# Patient Record
Sex: Female | Born: 1985 | Race: White | Hispanic: No | Marital: Married | Smoking: Never smoker
Health system: Southern US, Community
[De-identification: ages and names within clinical notes are randomized; demographics above are authoritative.]

## PROBLEM LIST (undated history)

## (undated) DIAGNOSIS — Z789 Other specified health status: Secondary | ICD-10-CM

## (undated) DIAGNOSIS — R87629 Unspecified abnormal cytological findings in specimens from vagina: Secondary | ICD-10-CM

## (undated) DIAGNOSIS — N390 Urinary tract infection, site not specified: Secondary | ICD-10-CM

## (undated) DIAGNOSIS — R000 Tachycardia, unspecified: Secondary | ICD-10-CM

## (undated) HISTORY — PX: WISDOM TOOTH EXTRACTION: SHX21

## (undated) HISTORY — DX: Other specified health status: Z78.9

## (undated) HISTORY — PX: COLPOSCOPY: SHX161

## (undated) HISTORY — DX: Tachycardia, unspecified: R00.0

---
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
## 2016-06-13 ENCOUNTER — Encounter: Payer: Self-pay | Admitting: Internal Medicine

## 2016-06-22 ENCOUNTER — Encounter (INDEPENDENT_AMBULATORY_CARE_PROVIDER_SITE_OTHER): Payer: Self-pay

## 2016-06-22 ENCOUNTER — Ambulatory Visit (INDEPENDENT_AMBULATORY_CARE_PROVIDER_SITE_OTHER): Payer: 59 | Admitting: Internal Medicine

## 2016-06-22 ENCOUNTER — Encounter: Payer: Self-pay | Admitting: Internal Medicine

## 2016-06-22 VITALS — BP 118/80 | HR 112 | Ht 66.0 in | Wt 130.8 lb

## 2016-06-22 DIAGNOSIS — R000 Tachycardia, unspecified: Secondary | ICD-10-CM

## 2016-06-22 NOTE — Patient Instructions (Signed)
Your physician recommends that you continue on your current medications as directed. Please refer to the Current Medication list given to you today. Your physician wants you to follow-up in: 1 YEAR WITH DR. ROSS.  You will receive a reminder letter in the mail two months in advance. If you don't receive a letter, please call our office to schedule the follow-up appointment.  

## 2016-06-22 NOTE — Progress Notes (Addendum)
Cardiology Office Note   Date:  06/22/2016   ID:  Carol Singleton, DOB 1985/12/24, MRN [id number]  PCP:  Frederich Chick, MD  Cardiologist:   Dietrich Pates, MD   Pt referred for continued eval of sinus tachycardia    History of Present Illness: Carol Singleton is a 31 y.o. female with a history of sinus tachycardia  Takes labetalol Sinus tacycardia was diagnosed as a teenager  Took atenolol for years  Then took a break and episodeds came back    Previously lived in [place]  Dx fall of 2008  [place]  Sister also has it   Took atenolol  Worked with EP   [facility] through 2013   In 2013 decided to go off of meds  Off until 2017  Felt like having more spells   Cardiologist  Followed  Wore monitor   Recomm  Exercises doing  cardio and wts  Rides bide  Does elliptical  No presyncope or syncope     Current Meds  Medication Sig  . labetalol (NORMODYNE) 100 MG tablet Take 100 mg by mouth 2 (two) times daily.  . norethindrone-ethinyl estradiol (JUNEL FE,GILDESS FE,LOESTRIN FE) 1-20 MG-MCG tablet Take 1 tablet by mouth daily.  . [DISCONTINUED] norethindrone-ethinyl estradiol (JUNEL 1/20) 1-20 MG-MCG tablet Take 1 tablet by mouth daily.  . [DISCONTINUED] norethindrone-ethinyl estradiol (MICROGESTIN,JUNEL,LOESTRIN) 1-20 MG-MCG tablet Take 1 tablet by mouth daily.     Allergies:   Patient has no known allergies.   Past Medical History:  Diagnosis Date  . Sinus tachycardia     Past Surgical History:  Procedure Laterality Date  . KNEE ARTHROSCOPY Left 2003     Social History:  The patient  reports that she has never smoked. She has never used smokeless tobacco. She reports that she drinks alcohol. She reports that she does not use drugs.   Family History:  The patient's family history includes CVA in her father; Congestive Heart Failure in her father; Stroke (age of onset: 108) in her father.    ROS:  Please see the history of present illness. All other  systems are reviewed and  Negative to the above problem except as noted.    PHYSICAL EXAM: VS:  BP 120/90   Pulse (!) 107   Ht 5\' 6"  (1.676 m)   Wt 130 lb 12.8 oz (59.3 kg)   BMI 21.11 kg/m   GEN: Well nourished, well developed, in no acute distress  HEENT: normal  Neck: no JVD, carotid bruits, or masses Cardiac: RRR; no murmurs, rubs, or gallops,no edema  Respiratory:  clear to auscultation bilaterally, normal work of breathing GI: soft, nontender, nondistended, + BS  No hepatomegaly  MS: no deformity Moving all extremities   Skin: warm and dry, no rash Neuro:  Strength and sensation are intact Psych: euthymic mood, full affect   EKG:  EKG is ordered today.  Sinus tachycardia  107 bpm    Lipid Panel No results found for: CHOL, TRIG, HDL, CHOLHDL, VLDL, LDLCALC, LDLDIRECT    Wt Readings from Last 3 Encounters:  06/22/16 130 lb 12.8 oz (59.3 kg)      ASSESSMENT AND PLAN:  1  Inapprop ST  Will have Dr Hebert Soho office send outside records    2  BP  Pt anxious  Says it is normally better    2  HCM  WIll get lipid from Dr Hebert Soho office   Tentatively plan f/u for 1 year  Current medicines are reviewed at length with the patient today.  The patient does not have concerns regarding medicines.   ADDENDUM:  06/29/2016  Notes from outside  [facility]  [place] in[place]rop ST vs autonomic insufficiency  Echo LVEF 60% (07/31/13)   Mention Dr Kristopher Oppenheimhemali for autonomic eval   Wore event monitor  Intolerant to lpressor, toprol and atenololl    Lipids 05/2013:  LDL 108  HDL 53  Total 179     Signed, Dietrich PatesPaula Yessika Otte, MD  06/22/2016 3:55 PM    [facility] [address], [place], [place]  [address] Phone: [phone]; Fax: [phone]

## 2016-07-18 ENCOUNTER — Other Ambulatory Visit (HOSPITAL_COMMUNITY)
Admission: RE | Admit: 2016-07-18 | Discharge: 2016-07-18 | Disposition: A | Discharge status: Home / Self Care | Payer: 59 | Source: Ambulatory Visit | Attending: Obstetrics & Gynecology | Admitting: Obstetrics & Gynecology

## 2016-07-18 ENCOUNTER — Other Ambulatory Visit: Payer: Self-pay | Admitting: Obstetrics & Gynecology

## 2016-07-18 DIAGNOSIS — Z1151 Encounter for screening for human papillomavirus (HPV): Secondary | ICD-10-CM | POA: Insufficient documentation

## 2016-07-18 DIAGNOSIS — Z01419 Encounter for gynecological examination (general) (routine) without abnormal findings: Secondary | ICD-10-CM | POA: Insufficient documentation

## 2016-07-23 LAB — CYTOLOGY - PAP
DIAGNOSIS: NEGATIVE
HPV (WINDOPATH): NOT DETECTED

## 2016-08-14 ENCOUNTER — Encounter: Payer: Self-pay | Admitting: *Deleted

## 2016-11-16 ENCOUNTER — Encounter: Payer: 59 | Admitting: Internal Medicine

## 2017-05-22 ENCOUNTER — Encounter: Payer: Self-pay | Admitting: Neurology

## 2017-08-12 ENCOUNTER — Encounter: Payer: Self-pay | Admitting: Neurology

## 2017-08-12 ENCOUNTER — Ambulatory Visit (INDEPENDENT_AMBULATORY_CARE_PROVIDER_SITE_OTHER): Payer: 59 | Admitting: Neurology

## 2017-08-12 VITALS — BP 100/70 | HR 96 | Ht 65.5 in | Wt 135.4 lb

## 2017-08-12 DIAGNOSIS — G500 Trigeminal neuralgia: Secondary | ICD-10-CM | POA: Diagnosis not present

## 2017-08-12 NOTE — Progress Notes (Signed)
[facility] Clinic Note - Initial Visit   Date: 08/12/17  Carol QuanLora Singleton MRN: [id number] DOB: 1985/11/18   Dear Dr. Hyman HopesWebb:  Thank you for your kind referral of Carol Singleton for consultation of facial pain. Although her history is well known to you, please allow us to reiterate it for the purpose of our medical record. The patient was accompanied to the clinic by self.    History of Present Illness: Carol Singleton is a 32 y.o. 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    Starting around the age of 5, she began having sharp shooting pain over the right cheek and radiates down into her jaw, lasting 20-30 seconds sometimes up to 1 minute.  Symptoms are very sporadic without any warning. There is no worsening with brushing teeth, chewing, or talking.  She has not had a spell over the past month, but other times it may occur daily.  She has not taken any medication for it.  She denies any facial weakness.  She has recently stopped her birth control and plans to get pregnant.   Her sister developed Bell's palsy around the age of 32, which self-resolved and returned in high school, which did not improve.    Past Medical History:  Diagnosis Date  . Sinus tachycardia     Past Surgical History:  Procedure Laterality Date  . KNEE ARTHROSCOPY Left 2003     Medications:  Outpatient Encounter Medications as of 08/12/2017  Medication Sig  . labetalol (NORMODYNE) 100 MG tablet Take 100 mg by mouth 2 (two) times daily.  . [DISCONTINUED] norethindrone-ethinyl estradiol (JUNEL FE,GILDESS FE,LOESTRIN FE) 1-20 MG-MCG tablet Take 1 tablet by mouth daily.   No facility-administered encounter medications on file as of 08/12/2017.      Allergies: No Known Allergies  Family History: Family History  Problem Relation Age of Onset  . Stroke Father [id number]       2009  . Congestive Heart Failure Father   . CVA  Father   . Bell's palsy Sister   . Heart attack Neg Hx     Social History: Social History   Tobacco Use  . Smoking status: Never Smoker  . Smokeless tobacco: Never Used  Substance Use Topics  . Alcohol use: Yes  . Drug use: No   Social History   Social History Narrative   Lives with husband in a 2 story home.  No children.  Works for [employer].  Education: Masters.      Review of Systems:  CONSTITUTIONAL: No fevers, chills, night sweats, or weight loss.   EYES: No visual changes or eye pain ENT: No hearing changes.  No history of nose bleeds.   RESPIRATORY: No cough, wheezing and shortness of breath.   CARDIOVASCULAR: Negative for chest pain, and palpitations.   GI: Negative for abdominal discomfort, blood in stools or black stools.  No recent change in bowel habits.   GU:  No history of incontinence.   MUSCLOSKELETAL: No history of joint pain or swelling.  No myalgias.   SKIN: Negative for lesions, rash, and itching.   HEMATOLOGY/ONCOLOGY: Negative for prolonged bleeding, bruising easily, and swollen nodes.  No history of cancer.   ENDOCRINE: Negative for cold or heat intolerance, polydipsia or goiter.   PSYCH:  No depression or anxiety symptoms.   NEURO: As Above.   Vital Signs:  BP 100/70   Pulse 96   Ht 5' 5.5" (1.664 m)  Wt 135 lb 6 oz (61.4 kg)   SpO2 98%   BMI 22.18 kg/m    General Medical Exam:   General:  Well appearing, comfortable.   Eyes/ENT: see cranial nerve examination.   Neck: No masses appreciated.  Full range of motion without tenderness.  No carotid bruits. Respiratory:  Clear to auscultation, good air entry bilaterally.   Cardiac:  Regular rate and rhythm, no murmur.   Extremities:  No deformities, edema, or skin discoloration.  Skin:  No rashes or lesions.  Neurological Exam: MENTAL STATUS including orientation to time, place, person, recent and remote memory, attention span and concentration, language, and fund of knowledge is  normal.  Speech is not dysarthric.  CRANIAL NERVES: II:  No visual field defects.  Unremarkable fundi.   III-IV-VI: Pupils equal round and reactive to light.  Normal conjugate, extra-ocular eye movements in all directions of gaze.  No nystagmus.  No ptosis.   V:  Normal facial sensation.   VII:  Normal facial symmetry and movements.   VIII:  Normal hearing and vestibular function.   IX-X:  Normal palatal movement.   XI:  Normal shoulder shrug and head rotation.   XII:  Normal tongue strength and range of motion, no deviation or fasciculation.  MOTOR: Motor strength is 5/5.  No atrophy, fasciculations or abnormal movements.  No pronator drift.  Tone is normal.    MSRs:  Right                                                                 Left brachioradialis 2+  brachioradialis 2+  biceps 2+  biceps 2+  triceps 2+  triceps 2+  patellar 2+  patellar 2+  ankle jerk 2+  ankle jerk 2+  Hoffman no  Hoffman no  plantar response down  plantar response down   SENSORY:  Normal and symmetric perception of light touch, pinprick, vibration, and proprioception.  Romberg's sign absent.   COORDINATION/GAIT: Normal finger-to- nose-finger.  Intact rapid alternating movements bilaterally.  Able to rise from a chair without using arms.  Gait narrow based and stable. Tandem and stressed gait intact.    IMPRESSION: Carol Singleton is a 32 year-old female with spells of transient right facial pain, most consistent with trigeminal neuralgia. Her exam is normal and non-focal.  It is atypical for symptoms to start at such a young age.  Recommend MRI/A brain (trigeminal nerve protocol) to evaluate for structural pathology such as a vascular loop around the trigeminal nerve.  She would like to review her health benefits first and will call our office to schedule testing. Symptoms are too intermittent and brief to warrant treatment with medication at this time.    Thank you for allowing me to participate in  patient's care.  If I can answer any additional questions, I would be pleased to do so.    Sincerely,    Noelene Gang K. Allena Katz, DO

## 2017-08-12 NOTE — Patient Instructions (Signed)
After reviewing your health benefits, please contact my office if you would like for us to order MRI brain for trigeminal neuralgia.

## 2018-03-08 LAB — OB RESULTS CONSOLE GC/CHLAMYDIA
Chlamydia: NEGATIVE
Gonorrhea: NEGATIVE

## 2018-03-08 LAB — OB RESULTS CONSOLE ANTIBODY SCREEN: Antibody Screen: NEGATIVE

## 2018-03-08 LAB — OB RESULTS CONSOLE RPR: RPR: NONREACTIVE

## 2018-03-08 LAB — OB RESULTS CONSOLE ABO/RH: RH Type: POSITIVE

## 2018-03-08 LAB — OB RESULTS CONSOLE RUBELLA ANTIBODY, IGM: Rubella: IMMUNE

## 2018-03-08 LAB — OB RESULTS CONSOLE HIV ANTIBODY (ROUTINE TESTING): HIV: NONREACTIVE

## 2018-03-08 LAB — OB RESULTS CONSOLE HEPATITIS B SURFACE ANTIGEN: Hepatitis B Surface Ag: NEGATIVE

## 2018-03-10 ENCOUNTER — Telehealth: Payer: Self-pay | Admitting: Internal Medicine

## 2018-03-10 NOTE — Telephone Encounter (Signed)
Left message for patient to call.

## 2018-03-10 NOTE — Telephone Encounter (Signed)
New Message   Pt c/o medication issue:  1. Name of Medication: labetalol (NORMODYNE) 100 MG tablet    2. How are you currently taking this medication (dosage and times per day)?   3. Are you having a reaction (difficulty breathing--STAT)?   4. What is your medication issue? Patient is calling because she is pregnant and is wanting to taper off this medication and would like to know the best way how. Please call to discuss.

## 2018-03-10 NOTE — Telephone Encounter (Signed)
I would recomm taht she take 50 bid     COme in for BP and HR check on day I am in clinic (0ne week)

## 2018-03-10 NOTE — Telephone Encounter (Signed)
Oral labetalol is considered appropriate for the treatment of chronic hypertension in pregnancy (ACOG 203 2019; Magee 2014).   Will need to consider risk/benefit of symptoms of tachycardia. Risk to fetus is bradycardia, hypoglycemia, and respiratory depression post delivery and fetal growth will need to be monitored.   We commonly use labetalol in pregnancy, but will defer to Dr. Tenny Crawoss for need to wean.   If plan to wean, she is already on low dose and could just discontinue or consider decreasing to 50mg  BID for 3 days then stop.

## 2018-03-10 NOTE — Telephone Encounter (Signed)
Pt called to report that she is [redacted] weeks pregnant and her OBGYN has recommended for her to stop/ wean off the labetalol 100mg  bid which she has been  taking for sinus tachycardia.. Last appt was with Dr. Tenny Crawoss on 06/22/16 and her next appt is scheduled with [place] weaver PA on 04/08/18. Advised her that I will talk with Dr. Tenny Crawoss and our [facility] and let her know their advice and recommendations. She reports that she has been feeling well but has been taking her med regularly and not sure how she will feel off of it but she is worried about her pregnancy.

## 2018-03-11 NOTE — Telephone Encounter (Signed)
Advised pt Dr. Tenny Crawoss and Prudence DavidsonKelley [facility] recommendations on her labetalol. Pt will decrease it to 50mg  po bid and will follow up in the office next week on 03/18/18. Pt will call if she develops any problems on the lower dose prior to that visit.

## 2018-03-18 ENCOUNTER — Encounter (INDEPENDENT_AMBULATORY_CARE_PROVIDER_SITE_OTHER): Payer: Self-pay

## 2018-03-18 ENCOUNTER — Ambulatory Visit (INDEPENDENT_AMBULATORY_CARE_PROVIDER_SITE_OTHER): Payer: 59 | Admitting: Internal Medicine

## 2018-03-18 ENCOUNTER — Encounter: Payer: Self-pay | Admitting: Internal Medicine

## 2018-03-18 VITALS — BP 122/86 | HR 103 | Ht 65.5 in | Wt 130.5 lb

## 2018-03-18 DIAGNOSIS — R000 Tachycardia, unspecified: Secondary | ICD-10-CM | POA: Diagnosis not present

## 2018-03-18 NOTE — Patient Instructions (Signed)
Medication Instructions:  Your physician has recommended you make the following change in your medication:  Taper off labetolol -  Take 25 mg twice a day for one week and then 25 mg once a day for one week then stop  If you need a refill on your cardiac medications before your next appointment, please call your pharmacy.   Lab work: none If you have labs (blood work) drawn today and your tests are completely normal, you will receive your results only by: Marland Kitchen. MyChart Message (if you have MyChart) OR . A paper copy in the mail If you have any lab test that is abnormal or we need to change your treatment, we will call you to review the results.  Testing/Procedures: none  Follow-Up: At [facility], you and your health needs are our priority.  As part of our continuing mission to provide you with exceptional heart care, we have created designated Provider Care Teams.  These Care Teams include your primary Cardiologist (physician) and Advanced Practice Providers (APPs -  Physician Assistants and Nurse Practitioners) who all work together to provide you with the care you need, when you need it. You will need a follow up appointment in:  9months.  Please call our office 2 months in advance to schedule this appointment.  You may see Dr. Tenny Crawoss or one of the following Advanced Practice Providers on your designated Care Team: Tereso NewcomerScott Weaver, PA-C Vin MegargelBhagat, [place] JerseyPA-C . Berton BonJanine Hammond, NP  Any Other Special Instructions Will Be Listed Below (If Applicable).

## 2018-03-18 NOTE — Progress Notes (Signed)
Cardiology Office Note   Date:  03/18/2018   ID:  Carol Singleton, DOB 06-02-1985, MRN [id number]  PCP:  Shirlean Mylar, MD  Cardiologist:   Dietrich Pates, MD   Pt referred for continued eval of sinus tachycardia    History of Present Illness: Carol Singleton is a 32 y.o. female with a history of sinus tachycardia.  She has had since she was a teen   Took atenolol   Now labetalol   Has been treated in [place], [place], [place] Seen by EP in past   Came off of meds on 2013   Off again in 2017   Echo LVEF 60% (07/31/13)  Wore event monitor  Intolerant to lpressor, toprol and atenololl       Pt is currently [redacted] wks pregnant Tired at times   Denies dizzienss  No SOB     Meds:  Labetalol 50 bid     Allergies:   Patient has no known allergies.   Past Medical History:  Diagnosis Date  . Sinus tachycardia     Past Surgical History:  Procedure Laterality Date  . KNEE ARTHROSCOPY Left 2003     Social History:  The patient  reports that she has never smoked. She has never used smokeless tobacco. She reports that she drinks alcohol. She reports that she does not use drugs.   Family History:  The patient's family history includes Bell's palsy in her sister; CVA in her father; Congestive Heart Failure in her father; Stroke (age of onset: 57) in her father.    ROS:  Please see the history of present illness. All other systems are reviewed and  Negative to the above problem except as noted.    PHYSICAL EXAM: VS:  BP 122/86   Pulse (!) 103   Ht 5' 5.5" (1.664 m)   Wt 130 lb 8 oz (59.2 kg)   SpO2 98%   BMI 21.39 kg/m   GEN: Well nourished, well developed, in no acute distress  HEENT: normal  Neck:   JVP is normal  No carotid bruits, or masses Cardiac: RRR; no murmurs, rubs, or gallops,no edema  Respiratory:  clear to auscultation bilaterally, normal work of breathing GI: soft, nontender, nondistended, + BS  No hepatomegaly  MS: no deformity Moving all extremities   Skin: warm  and dry, no rash Neuro:  Strength and sensation are intact Psych: euthymic mood, full affect   EKG:  EKG is ordered today.  Sinus tachycardia  103 bpm    Lipid Panel No results found for: CHOL, TRIG, HDL, CHOLHDL, VLDL, LDLCALC, LDLDIRECT    Wt Readings from Last 3 Encounters:  03/18/18 130 lb 8 oz (59.2 kg)  08/12/17 135 lb 6 oz (61.4 kg)  06/22/16 130 lb 12.8 oz (59.3 kg)      ASSESSMENT AND PLAN:  1  Inapprop sinus tachycardai   She is tolerating   Currently [redacted] wks pregnant   Would continue taper of labealol over next couple weeks to off.    I did recomm that she stay hydrated, increased salt intake, stay trained   This prob reflectss a type of dysautonomia.   OVerall, I warned her that Wks 12/13 are probably greatest risk of syncope After that gets better    2  BP  BP is OK    2  HCM  WIll get lipid from Dr Hebert Soho office   F/U in September after she delivers  Sooner if problems develop  Current medicines  are reviewed at length with the patient today.  The patient does not have concerns regarding medicines.   ADDENDUM:  06/29/2016  Notes from outside  [facility]  [facility] inapprop ST vs autonomic insufficiency  Echo LVEF 60% (07/31/13)   Mention Dr Kristopher Oppenheimhemali for autonomic eval   Wore event monitor  Intolerant to lpressor, toprol and atenololl    Lipids 05/2013:  LDL 108  HDL 53  Total 179     Signed, Dietrich PatesPaula Jacyln Carmer, MD  03/18/2018 11:22 PM    [facility] [address], [place], [place]  [address] Phone: [phone](336) [phone]; Fax: [phone]

## 2018-04-08 ENCOUNTER — Ambulatory Visit: Payer: 59 | Admitting: Physician Assistant

## 2018-04-23 NOTE — L&D Delivery Note (Signed)
Delivery Note At 9:15 PM a viable female was delivered via Vaginal, Spontaneous (Presentation: drect OA).  APGAR: 9, 9; weight pending.   Placenta status: active management was started and pitocin given along with gentle traction. Cervical canal contracted around midbody of placenta and lead to cord evulsion. Patient was not bleeding so pitocin was stopped to allow cervix to dilate and placenta was manually removed with gentle traction on the portion present in the vagina. The placenta was examined and found to be intact. Consulted Dr. Mancel Bale who declines beginning antibiotics at this time as the uterus was never entered. Cord: 3 vessel with the following complications: n/a.    Anesthesia:  Epidural Episiotomy:  None Lacerations: 1st degree;Sulcus;Labial Suture Repair: 3.0 vicryl Est. Blood Loss (mL): 298  Mom to postpartum.  Baby to Couplet care / Skin to Skin.  Carol Singleton 11/07/2018, 10:05 PM

## 2018-07-07 ENCOUNTER — Telehealth: Payer: Self-pay | Admitting: Internal Medicine

## 2018-07-07 NOTE — Telephone Encounter (Signed)
Pt is [redacted] weeks pregnant, and weaned off of her Beta-blocker as soon as she found out about her pregnancy. 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 She needs advice for best keeping her unborn baby safe and avoiding a possible heart attack.

## 2018-07-07 NOTE — Telephone Encounter (Signed)
-----  Has to be at work there right now.  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 Possibly could work from home if letter from her doctor.  Adv she should discuss this with OB, may be able to have less hands on work for right now.     ------Tapering off labetalol, had been feeling fine, HR was high like normal 100-125 bpm but manageable.  Over weekend very high, 190 bpm at rest SOB, just sitting in bed.  Notes that her anxiety over working, the virus, has made this worse.    Doesn't want to go back onto labetalol while pregnant.  Staying hydrated.  Took pulse while on the phone 156.  Adv she may need to restart medication and that I will review with Dr. Tenny Craw and call her back.

## 2018-07-09 NOTE — Telephone Encounter (Signed)
Called pt    She is feeling some better   No syncope   Does not take BP Not working now, some from home This has eased stress  Recomm: Hydrate. Salt intake adequate Get BPcuff Keep active as tolerate   Rest when tired. Keep up through MyChart

## 2018-10-07 LAB — OB RESULTS CONSOLE GBS: GBS: NEGATIVE

## 2018-10-30 ENCOUNTER — Other Ambulatory Visit: Payer: Self-pay

## 2018-10-30 ENCOUNTER — Encounter (HOSPITAL_COMMUNITY): Payer: Self-pay

## 2018-10-30 ENCOUNTER — Inpatient Hospital Stay (HOSPITAL_COMMUNITY)
Admission: AD | Admit: 2018-10-30 | Discharge: 2018-10-30 | Disposition: A | Discharge status: Home / Self Care | Payer: Managed Care, Other (non HMO) | Attending: Obstetrics and Gynecology | Admitting: Obstetrics and Gynecology

## 2018-10-30 DIAGNOSIS — O26893 Other specified pregnancy related conditions, third trimester: Secondary | ICD-10-CM | POA: Diagnosis present

## 2018-10-30 DIAGNOSIS — Z3A40 40 weeks gestation of pregnancy: Secondary | ICD-10-CM | POA: Insufficient documentation

## 2018-10-30 DIAGNOSIS — Z3A39 39 weeks gestation of pregnancy: Secondary | ICD-10-CM | POA: Diagnosis not present

## 2018-10-30 DIAGNOSIS — Z0371 Encounter for suspected problem with amniotic cavity and membrane ruled out: Secondary | ICD-10-CM | POA: Diagnosis not present

## 2018-10-30 DIAGNOSIS — Z3689 Encounter for other specified antenatal screening: Secondary | ICD-10-CM

## 2018-10-30 LAB — URINALYSIS, ROUTINE W REFLEX MICROSCOPIC
Bilirubin Urine: NEGATIVE
Glucose, UA: NEGATIVE mg/dL
Hgb urine dipstick: NEGATIVE
Ketones, ur: NEGATIVE mg/dL
Nitrite: NEGATIVE
Protein, ur: NEGATIVE mg/dL
Specific Gravity, Urine: 1.004 — ABNORMAL LOW (ref 1.005–1.030)
pH: 6 (ref 5.0–8.0)

## 2018-10-30 LAB — AMNISURE RUPTURE OF MEMBRANE (ROM) NOT AT ARMC: Amnisure ROM: NEGATIVE

## 2018-10-30 NOTE — MAU Provider Note (Signed)
History     CSN: [id number]  Arrival date and time: 10/30/18 1610   First Provider Initiated Contact with Patient 10/30/18 1715      Chief Complaint  Patient presents with  . Rupture of Membranes   HPI Dr. Richardson Doppole called and wanted amnisure done as client has been having wetness of her clothing and is not sure if her water has broken.  Having some contractions but is not in pain.  Has had some sticky mucus also which is likely her mucus plug.  OB History    Gravida  1   Para      Term      Preterm      AB      Living        SAB      TAB      Ectopic      Multiple      Live Births              Past Medical History:  Diagnosis Date  . Sinus tachycardia     Past Surgical History:  Procedure Laterality Date  . KNEE ARTHROSCOPY Left 2003    Family History  Problem Relation Age of Onset  . Stroke Father [id number]       2009  . Congestive Heart Failure Father   . CVA Father   . Bell's palsy Sister   . Heart attack Neg Hx     Social History   Tobacco Use  . Smoking status: Never Smoker  . Smokeless tobacco: Never Used  Substance Use Topics  . Alcohol use: Not Currently  . Drug use: No    Allergies: No Known Allergies  No medications prior to admission.    Review of Systems  Constitutional: Negative for fever.  Respiratory: Negative for cough and shortness of breath.   Gastrointestinal: Negative for abdominal pain, diarrhea, nausea and vomiting.  Genitourinary: Negative for dysuria, vaginal bleeding and vaginal discharge.       Leaking of fluid?   Physical Exam   Blood pressure 113/79, pulse (!) 114, resp. rate 18.  Physical Exam  Nursing note and vitals reviewed. Constitutional: She is oriented to person, place, and time. She appears well-developed and well-nourished.  HENT:  Head: Normocephalic.  Eyes: EOM are normal.  Neck: Neck supple.  GI: Soft. There is no abdominal tenderness. There is no rebound and no guarding.  FHT baseline is  130 with moderate variability and greater than 15x15 accels.  No decelerations.  Irregular contractions at 4-8 minutes - not painful.  Reactive NST.  Genitourinary:    Genitourinary Comments: Speculum exam Hard to visualize cervix well.  No leaking of fluid.  No leaking with valsalva. Amnisure done. Bimanual  - vertex position -1, cervix is posterior and can barely reach.  Was 2 cm in the office.   Musculoskeletal: Normal range of motion.  Neurological: She is alert and oriented to person, place, and time.  Skin: Skin is warm and dry.  Psychiatric: She has a normal mood and affect.    MAU Course  Procedures Results for orders placed or performed during the hospital encounter of 10/30/18 (from the past 24 hour(s))  Urinalysis, Routine w reflex microscopic     Status: Abnormal   Collection Time: 10/30/18  4:28 PM  Result Value Ref Range   Color, Urine YELLOW YELLOW   APPearance CLEAR CLEAR   Specific Gravity, Urine 1.004 (L) 1.005 - 1.030   pH 6.0 5.0 - 8.0  Glucose, UA NEGATIVE NEGATIVE mg/dL   Hgb urine dipstick NEGATIVE NEGATIVE   Bilirubin Urine NEGATIVE NEGATIVE   Ketones, ur NEGATIVE NEGATIVE mg/dL   Protein, ur NEGATIVE NEGATIVE mg/dL   Nitrite NEGATIVE NEGATIVE   Leukocytes,Ua MODERATE (A) NEGATIVE   RBC / HPF 0-5 0 - 5 RBC/hpf   WBC, UA 6-10 0 - 5 WBC/hpf   Bacteria, UA MANY (A) NONE SEEN   Squamous Epithelial / LPF 0-5 0 - 5  Amnisure rupture of membrane (rom)not at [facility]     Status: None   Collection Time: 10/30/18  4:57 PM  Result Value Ref Range   Amnisure ROM NEGATIVE     MDM Reviewed signs of labor.  Client is not in labor at this time but does have some irregular contractions - may be in early but not active labor.  This may stop completely or if the contractions become stronger and more intense, this may signal the beginning of labor.  Negative amnisure and clinical exam does not show rupture of membranes.  Will discharge as there has been no cervical change  since the check in the office.  Assessment and Plan  No ROM - negative amnisure Reactive NST  Plan Keep your appointments in the office as scheduled. Return here if your water breaks, or if you have vaginal bleeding or if your baby is not moving well. See additional info given to client on AVS.  Joram Venson L Que Meneely 10/30/2018, 11:08 PM

## 2018-10-30 NOTE — MAU Note (Signed)
Pt reports she has had several instances of fluid trickling out over the past week. Pt reports she spoke with her OB provider today and was advised to come to MAU for evaluation.  Pt denies any vaginal bleeding or pain at this time Reports good fetal movement.

## 2018-10-30 NOTE — Discharge Instructions (Signed)
Keep your appointments in the office as scheduled. Return here if your water breaks, or if you have vaginal bleeding or if your baby is not moving well.

## 2018-11-03 ENCOUNTER — Telehealth (HOSPITAL_COMMUNITY): Payer: Self-pay | Admitting: *Deleted

## 2018-11-03 ENCOUNTER — Encounter (HOSPITAL_COMMUNITY): Payer: Self-pay

## 2018-11-03 NOTE — Telephone Encounter (Signed)
Preadmission screen  

## 2018-11-04 ENCOUNTER — Telehealth (HOSPITAL_COMMUNITY): Payer: Self-pay | Admitting: *Deleted

## 2018-11-04 NOTE — Telephone Encounter (Signed)
Preadmission screen  

## 2018-11-05 ENCOUNTER — Telehealth (HOSPITAL_COMMUNITY): Payer: Self-pay | Admitting: *Deleted

## 2018-11-05 ENCOUNTER — Encounter (HOSPITAL_COMMUNITY): Payer: Self-pay | Admitting: *Deleted

## 2018-11-05 NOTE — Telephone Encounter (Signed)
Preadmission screen  

## 2018-11-07 ENCOUNTER — Inpatient Hospital Stay (HOSPITAL_COMMUNITY)
Admission: AD | Admit: 2018-11-07 | Discharge: 2018-11-09 | DRG: 807 | Disposition: A | Discharge status: Home / Self Care | Payer: Managed Care, Other (non HMO) | Attending: Obstetrics & Gynecology | Admitting: Obstetrics & Gynecology

## 2018-11-07 ENCOUNTER — Inpatient Hospital Stay (HOSPITAL_COMMUNITY): Payer: Managed Care, Other (non HMO) | Admitting: Anesthesiology

## 2018-11-07 ENCOUNTER — Other Ambulatory Visit: Payer: Self-pay

## 2018-11-07 ENCOUNTER — Encounter (HOSPITAL_COMMUNITY): Payer: Self-pay

## 2018-11-07 DIAGNOSIS — Z3A40 40 weeks gestation of pregnancy: Secondary | ICD-10-CM

## 2018-11-07 DIAGNOSIS — R000 Tachycardia, unspecified: Secondary | ICD-10-CM | POA: Diagnosis present

## 2018-11-07 DIAGNOSIS — O368130 Decreased fetal movements, third trimester, not applicable or unspecified: Secondary | ICD-10-CM | POA: Diagnosis present

## 2018-11-07 DIAGNOSIS — O365930 Maternal care for other known or suspected poor fetal growth, third trimester, not applicable or unspecified: Principal | ICD-10-CM | POA: Diagnosis present

## 2018-11-07 DIAGNOSIS — Z1159 Encounter for screening for other viral diseases: Secondary | ICD-10-CM

## 2018-11-07 DIAGNOSIS — O26893 Other specified pregnancy related conditions, third trimester: Secondary | ICD-10-CM | POA: Diagnosis present

## 2018-11-07 LAB — CBC
HCT: 39.7 % (ref 36.0–46.0)
Hemoglobin: 13.9 g/dL (ref 12.0–15.0)
MCH: 32.3 pg (ref 26.0–34.0)
MCHC: 35 g/dL (ref 30.0–36.0)
MCV: 92.1 fL (ref 80.0–100.0)
Platelets: 151 10*3/uL (ref 150–400)
RBC: 4.31 MIL/uL (ref 3.87–5.11)
RDW: 13.2 % (ref 11.5–15.5)
WBC: 8.5 10*3/uL (ref 4.0–10.5)
nRBC: 0 % (ref 0.0–0.2)

## 2018-11-07 LAB — TYPE AND SCREEN
ABO/RH(D): A POS
Antibody Screen: NEGATIVE

## 2018-11-07 LAB — SARS CORONAVIRUS 2 BY RT PCR (HOSPITAL ORDER, PERFORMED IN ~~LOC~~ HOSPITAL LAB): SARS Coronavirus 2: NEGATIVE

## 2018-11-07 LAB — ABO/RH: ABO/RH(D): A POS

## 2018-11-07 MED ORDER — ZOLPIDEM TARTRATE 5 MG PO TABS: 5.0000 mg | ORAL_TABLET | Freq: Every evening | ORAL | Status: DC | PRN

## 2018-11-07 MED ORDER — OXYTOCIN 40 UNITS IN NORMAL SALINE INFUSION - SIMPLE MED
2.5000 [IU]/h | INTRAVENOUS | Status: DC
  Administered 2018-11-07: 2.5 [IU]/h via INTRAVENOUS

## 2018-11-07 MED ORDER — ONDANSETRON HCL 4 MG PO TABS: 4.0000 mg | ORAL_TABLET | ORAL | Status: DC | PRN

## 2018-11-07 MED ORDER — SENNOSIDES-DOCUSATE SODIUM 8.6-50 MG PO TABS
2.0000 | ORAL_TABLET | ORAL | Status: DC
  Administered 2018-11-08 (×2): 2 via ORAL
  Filled 2018-11-07 (×2): qty 2

## 2018-11-07 MED ORDER — PHENYLEPHRINE 40 MCG/ML (10ML) SYRINGE FOR IV PUSH (FOR BLOOD PRESSURE SUPPORT): 80.0000 ug | PREFILLED_SYRINGE | INTRAVENOUS | Status: DC | PRN

## 2018-11-07 MED ORDER — TETANUS-DIPHTH-ACELL PERTUSSIS 5-2.5-18.5 LF-MCG/0.5 IM SUSP: 0.5000 mL | Freq: Once | INTRAMUSCULAR | Status: DC

## 2018-11-07 MED ORDER — PRENATAL MULTIVITAMIN CH
1.0000 | ORAL_TABLET | Freq: Every day | ORAL | Status: DC
  Administered 2018-11-08: 1 via ORAL
  Filled 2018-11-07: qty 1

## 2018-11-07 MED ORDER — WITCH HAZEL-GLYCERIN EX PADS: 1.0000 "application " | MEDICATED_PAD | CUTANEOUS | Status: DC | PRN

## 2018-11-07 MED ORDER — ACETAMINOPHEN 325 MG PO TABS: 650.0000 mg | ORAL_TABLET | ORAL | Status: DC | PRN

## 2018-11-07 MED ORDER — EPHEDRINE 5 MG/ML INJ: 10.0000 mg | INTRAVENOUS | Status: DC | PRN

## 2018-11-07 MED ORDER — SOD CITRATE-CITRIC ACID 500-334 MG/5ML PO SOLN
30.0000 mL | ORAL | Status: DC | PRN
  Administered 2018-11-07 (×2): 30 mL via ORAL
  Filled 2018-11-07 (×2): qty 30

## 2018-11-07 MED ORDER — FENTANYL-BUPIVACAINE-NACL 0.5-0.125-0.9 MG/250ML-% EP SOLN
12.0000 mL/h | EPIDURAL | Status: DC | PRN
  Filled 2018-11-07: qty 250

## 2018-11-07 MED ORDER — OXYCODONE-ACETAMINOPHEN 5-325 MG PO TABS: 2.0000 | ORAL_TABLET | ORAL | Status: DC | PRN

## 2018-11-07 MED ORDER — OXYCODONE-ACETAMINOPHEN 5-325 MG PO TABS: 1.0000 | ORAL_TABLET | ORAL | Status: DC | PRN

## 2018-11-07 MED ORDER — PHENYLEPHRINE 40 MCG/ML (10ML) SYRINGE FOR IV PUSH (FOR BLOOD PRESSURE SUPPORT)
80.0000 ug | PREFILLED_SYRINGE | INTRAVENOUS | Status: DC | PRN
  Filled 2018-11-07: qty 10

## 2018-11-07 MED ORDER — OXYTOCIN 40 UNITS IN NORMAL SALINE INFUSION - SIMPLE MED
1.0000 m[IU]/min | INTRAVENOUS | Status: DC
  Administered 2018-11-07: 2 m[IU]/min via INTRAVENOUS
  Filled 2018-11-07: qty 1000

## 2018-11-07 MED ORDER — SODIUM CHLORIDE (PF) 0.9 % IJ SOLN
INTRAMUSCULAR | Status: DC | PRN
  Administered 2018-11-07: 12 mL/h via EPIDURAL

## 2018-11-07 MED ORDER — DIPHENHYDRAMINE HCL 50 MG/ML IJ SOLN: 12.5000 mg | INTRAMUSCULAR | Status: DC | PRN

## 2018-11-07 MED ORDER — LACTATED RINGERS IV SOLN: 500.0000 mL | Freq: Once | INTRAVENOUS | Status: DC

## 2018-11-07 MED ORDER — FENTANYL CITRATE (PF) 100 MCG/2ML IJ SOLN: 50.0000 ug | INTRAMUSCULAR | Status: DC | PRN

## 2018-11-07 MED ORDER — ONDANSETRON HCL 4 MG/2ML IJ SOLN: 4.0000 mg | INTRAMUSCULAR | Status: DC | PRN

## 2018-11-07 MED ORDER — LACTATED RINGERS IV SOLN
INTRAVENOUS | Status: DC
  Administered 2018-11-07 (×3): via INTRAVENOUS

## 2018-11-07 MED ORDER — IBUPROFEN 600 MG PO TABS
600.0000 mg | ORAL_TABLET | Freq: Four times a day (QID) | ORAL | Status: DC
  Administered 2018-11-08 – 2018-11-09 (×7): 600 mg via ORAL
  Filled 2018-11-07 (×6): qty 1

## 2018-11-07 MED ORDER — TERBUTALINE SULFATE 1 MG/ML IJ SOLN: 0.2500 mg | Freq: Once | INTRAMUSCULAR | Status: DC | PRN

## 2018-11-07 MED ORDER — DIBUCAINE (PERIANAL) 1 % EX OINT: 1.0000 "application " | TOPICAL_OINTMENT | CUTANEOUS | Status: DC | PRN

## 2018-11-07 MED ORDER — CALCIUM CARBONATE ANTACID 500 MG PO CHEW: 1.0000 | CHEWABLE_TABLET | Freq: Every day | ORAL | Status: DC | PRN

## 2018-11-07 MED ORDER — ONDANSETRON HCL 4 MG/2ML IJ SOLN: 4.0000 mg | Freq: Four times a day (QID) | INTRAMUSCULAR | Status: DC | PRN

## 2018-11-07 MED ORDER — OXYTOCIN BOLUS FROM INFUSION
500.0000 mL | Freq: Once | INTRAVENOUS | Status: AC
  Administered 2018-11-07: 500 mL via INTRAVENOUS

## 2018-11-07 MED ORDER — LACTATED RINGERS IV SOLN
500.0000 mL | INTRAVENOUS | Status: DC | PRN
  Administered 2018-11-07: 15:00:00 500 mL via INTRAVENOUS

## 2018-11-07 MED ORDER — LIDOCAINE HCL (PF) 1 % IJ SOLN
INTRAMUSCULAR | Status: DC | PRN
  Administered 2018-11-07 (×2): 5 mL via EPIDURAL

## 2018-11-07 MED ORDER — BENZOCAINE-MENTHOL 20-0.5 % EX AERO
1.0000 "application " | INHALATION_SPRAY | CUTANEOUS | Status: DC | PRN
  Administered 2018-11-08: 1 via TOPICAL
  Filled 2018-11-07: qty 56

## 2018-11-07 MED ORDER — LIDOCAINE HCL (PF) 1 % IJ SOLN: 30.0000 mL | INTRAMUSCULAR | Status: DC | PRN

## 2018-11-07 MED ORDER — DIPHENHYDRAMINE HCL 25 MG PO CAPS: 25.0000 mg | ORAL_CAPSULE | Freq: Four times a day (QID) | ORAL | Status: DC | PRN

## 2018-11-07 MED ORDER — SIMETHICONE 80 MG PO CHEW: 80.0000 mg | CHEWABLE_TABLET | ORAL | Status: DC | PRN

## 2018-11-07 MED ORDER — COCONUT OIL OIL: 1.0000 "application " | TOPICAL_OIL | Status: DC | PRN

## 2018-11-07 NOTE — Progress Notes (Signed)
This note also relates to the following rows which could not be included: Dose (milli-units/min) Oxytocin - Cannot attach notes to extension rows Rate (mL/hr) Oxytocin - Cannot attach notes to extension rows Concentration Oxytocin - Cannot attach notes to extension rows  Pitocin off prior to AROM per Dr Nelda Marseille

## 2018-11-07 NOTE — Progress Notes (Signed)
This note also relates to the following rows which could not be included: Dose (milli-units/min) Oxytocin - Cannot attach notes to extension rows Rate (mL/hr) Oxytocin - Cannot attach notes to extension rows Concentration Oxytocin - Cannot attach notes to extension rows  Pitocin restarted at 58milli-units per dr Nelda Marseille after AROM

## 2018-11-07 NOTE — Progress Notes (Signed)
OB PN:  S: Pt was having painful contractions- s/p epidural, resting comfortably  O: BP 117/72   Pulse 74   Temp 97.8 F (36.6 C)   Resp 16   Ht 5' 5.5" (1.664 m)   Wt 68 kg   SpO2 100%   BMI 24.58 kg/m   FHT: 120 bpm, moderate variablity, + accels, variable decel x1 with AROM- no other decels noted Toco: q16min SVE: ant lip/complete/0  A/P: 33 y.o. G1P0 @ [redacted]w[redacted]d for IOL due to BPP 4/8 1. FWB: Cat. I 2. Labor: continue Pit Pain: continue epidural GBS: negative  Janyth Pupa, DO [phone] (cell) [phone] (office)

## 2018-11-07 NOTE — Anesthesia Procedure Notes (Signed)
Epidural Patient location during procedure: OB Start time: 11/07/2018 3:44 PM End time: 11/07/2018 3:53 PM  Staffing Anesthesiologist: Albertha Ghee, MD Performed: anesthesiologist   Preanesthetic Checklist Completed: patient identified, site marked, pre-op evaluation, timeout performed, IV checked, risks and benefits discussed and monitors and equipment checked  Epidural Patient position: sitting Prep: DuraPrep Patient monitoring: heart rate, cardiac monitor, continuous pulse ox and blood pressure Approach: midline Location: L2-L3 Injection technique: LOR saline  Needle:  Needle type: Tuohy  Needle gauge: 17 G Needle length: 9 cm Needle insertion depth: 5 cm Catheter type: closed end flexible Catheter size: 19 Gauge Catheter at skin depth: 11 cm Test dose: negative and Other  Assessment Events: blood not aspirated, injection not painful, no injection resistance and negative IV test  Additional Notes Informed consent obtained prior to proceeding including risk of failure, 1% risk of PDPH, risk of minor discomfort and bruising.  Discussed rare but serious complications including epidural abscess, permanent nerve injury, epidural hematoma.  Discussed alternatives to epidural analgesia and patient desires to proceed.  Timeout performed pre-procedure verifying patient name, procedure, and platelet count.  Patient tolerated procedure well. Reason for block:procedure for pain

## 2018-11-07 NOTE — H&P (Signed)
HPI: 33 y/o G1P0 @ [redacted]w[redacted]d estimated gestational age (as dated by 20 week ultrasound) presents due to [facility] 4/8.  Pt noted decreased fetal movement and small for dates- NST reactive; however BPP 4/8- no tone or breathing, AFI: 10  no Leaking of Fluid,   no Vaginal Bleeding,   irregular Uterine Contractions,  + Fetal Movement, but decreased.  Prenatal care has been provided by Dr. Nelda Marseille  ROS: no HA, no epigastric pain, no visual changes.    Pregnancy complicated by: -sinus tachycardia- previously on Labetalol   Prenatal Transfer Tool  Maternal Diabetes: No Genetic Screening: Normal Maternal Ultrasounds/Referrals: Normal Fetal Ultrasounds or other Referrals:  None Maternal Substance Abuse:  No Significant Maternal Medications:  None Significant Maternal Lab Results: Group B Strep negative   PNL:  GBS neg, Rub Immune, Hep B neg, RPR NR, HIV neg, GC/C neg, glucola:151, 3hr wnl Hgb: 12.7 Blood type: A positive, antibody neg  Immunizations: Tdap: 5/19 Flu: 11/14  OBHx: primip PMHx:  Sinus tachycardia Meds:  PNV Allergy:   Allergies  Allergen Reactions  . Latex Rash   SurgHx: none SocHx:   no Tobacco, no  EtOH, no Illicit Drugs  O: There were no vitals taken for this visit. Gen. AAOx3, NAD CV.  RRR  No murmur.  Resp. CTAB, no wheeze or crackles. Abd. Gravid,  no tenderness,  no rigidity,  no guarding Extr.  no edema B/L , no calf tenderness, neg Homan's B/L FHT: 140 baseline, moderate variability, + accels,  no decels Toco: irregular SVE: 3-4/60/-2, vertex, palpable bag  [place] today: vertex, EFW, 8#8oz (89%)   Labs: see orders  A/P:  33 y.o. G1P0 @ [redacted]w[redacted]d EGA who presents for IOL due to abnormal antepartum fetal surveillance- BPP 4/8 -FWB:  NICHD Cat I FHTs -Labor: plan for Pit per protocol -GBS: negative -Pain management: IV or epidural upon request  Janyth Pupa, DO [phone] (cell) [phone] (office)

## 2018-11-07 NOTE — Anesthesia Preprocedure Evaluation (Signed)
Anesthesia Evaluation  Patient identified by MRN, date of birth, ID band Patient awake    Reviewed: Allergy & Precautions, H&P , NPO status , Patient's Chart, lab work & pertinent test results  Airway Mallampati: II   Neck ROM: full    Dental   Pulmonary neg pulmonary ROS,    breath sounds clear to auscultation       Cardiovascular + dysrhythmias Supra Ventricular Tachycardia  Rhythm:regular Rate:Normal     Neuro/Psych    GI/Hepatic   Endo/Other    Renal/GU      Musculoskeletal   Abdominal   Peds  Hematology   Anesthesia Other Findings   Reproductive/Obstetrics (+) Pregnancy                             Anesthesia Physical Anesthesia Plan  ASA: II  Anesthesia Plan: Epidural   Post-op Pain Management:    Induction: Intravenous  PONV Risk Score and Plan: 2 and Treatment may vary due to age or medical condition  Airway Management Planned: Natural Airway  Additional Equipment:   Intra-op Plan:   Post-operative Plan:   Informed Consent: I have reviewed the patients History and Physical, chart, labs and discussed the procedure including the risks, benefits and alternatives for the proposed anesthesia with the patient or authorized representative who has indicated his/her understanding and acceptance.       Plan Discussed with: CRNA, Anesthesiologist and Surgeon  Anesthesia Plan Comments:         Anesthesia Quick Evaluation

## 2018-11-07 NOTE — MAU Note (Signed)
COvid swab collected

## 2018-11-08 ENCOUNTER — Encounter (HOSPITAL_COMMUNITY): Payer: Self-pay

## 2018-11-08 LAB — CBC
HCT: 30.9 % — ABNORMAL LOW (ref 36.0–46.0)
Hemoglobin: 10.7 g/dL — ABNORMAL LOW (ref 12.0–15.0)
MCH: 32.5 pg (ref 26.0–34.0)
MCHC: 34.6 g/dL (ref 30.0–36.0)
MCV: 93.9 fL (ref 80.0–100.0)
Platelets: 132 10*3/uL — ABNORMAL LOW (ref 150–400)
RBC: 3.29 MIL/uL — ABNORMAL LOW (ref 3.87–5.11)
RDW: 13.2 % (ref 11.5–15.5)
WBC: 13.4 10*3/uL — ABNORMAL HIGH (ref 4.0–10.5)
nRBC: 0 % (ref 0.0–0.2)

## 2018-11-08 LAB — RPR: RPR Ser Ql: NONREACTIVE

## 2018-11-08 NOTE — Anesthesia Postprocedure Evaluation (Signed)
Anesthesia Post Note  Patient: Carol Singleton  Procedure(s) Performed: AN AD HOC LABOR EPIDURAL     Patient location during evaluation: Mother Baby Anesthesia Type: Epidural Level of consciousness: awake and alert Pain management: pain level controlled Vital Signs Assessment: post-procedure vital signs reviewed and stable Respiratory status: spontaneous breathing, nonlabored ventilation and respiratory function stable Cardiovascular status: stable Postop Assessment: no headache, no backache and epidural receding Anesthetic complications: no    Last Vitals:  Vitals:   11/08/18 0050 11/08/18 0500  BP: 92/70 102/72  Pulse: 86 83  Resp: 18 18  Temp: 36.7 C 36.7 C  SpO2: 99% 100%    Last Pain:  Vitals:   11/08/18 0500  TempSrc: Oral  PainSc: 4    Pain Goal:                   Rayvon Char

## 2018-11-08 NOTE — Progress Notes (Signed)
Postpartum Note Day # 1  S:  Patient resting comfortable in bed.  Pain controlled.  Tolerating general diet. No flatus, no BM.  Lochia moderate.  Ambulating with assistance as her left leg is somewhat still numb.  She denies n/v/f/c, SOB, or CP.  Pt plans on breastfeeding.  O: Temp:  [97.7 F (36.5 C)-98.4 F (36.9 C)] 98 F (36.7 C) (07/18 0500) Pulse Rate:  [65-141] 83 (07/18 0500) Resp:  [16-20] 18 (07/18 0500) BP: (92-131)/(52-84) 102/72 (07/18 0500) SpO2:  [99 %-100 %] 100 % (07/18 0500) Weight:  [68 kg] 68 kg (07/17 1250)   Gen: A&Ox3, NAD CV: RRR, no MRG Resp: CTAB Abdomen: soft, NT, ND Uterus: firm, non-tender, below umbilicus Ext: No edema, no calf tenderness bilaterally  Labs: CBC pending  A/P: Pt is a 33 y.o. G1P1001 s/p NSVD, PPD#1  - Pain well controlled -GU: voiding freely -GI: Tolerating general diet -Activity: encouraged sitting up to chair and ambulation as tolerated -Prophylaxis: early ambulation -Labs: pending this am  Continue with routine postpartum care  Janyth Pupa, DO [phone] (cell) [phone] (office)

## 2018-11-08 NOTE — Lactation Note (Signed)
This note was copied from a baby's chart. Lactation Consultation Note  Patient Name: Carol Singleton [id number] Date: 11/08/2018 Reason for consult: Initial assessment;Primapara;1st time breastfeeding;Term  37 hours old FT female who is being exclusively BF by his mother, she's a P1. Mom reported (+) breast changes during the pregnancy and she's already familiar with hand expression. When [facility] assisted with hand expression, colostrum was easily obtained out of both breast. She has a DEBP at home.  RN Anise Salvo was already helping mom latching baby to the breast, LC helped with positioning, mom tried the football hold, but she didn't like it, she preferred switching to cross cradle, baby latched easily, had to repositioned a couple of times when he started loosing depth at the breast but after that he was able to complete a 28 minutes feeding with a few audible swallows noted. Parents very pleased, dad changed baby's diaper, he had a void and a stool.  Baby is being monitored for jaundice due to bruising on the face, LC offered to set up a DEBP and mom agreed, she started pumping as soon as she got done nursing baby. Instructions, cleaning and storage were reviewed, as well as milk storage guidelines. Reviewed normal newborn behavior, cluster feeding, newborn jaundice and feeding cues.   Feeding plan:  1. Encouraged mom to feed baby STS 8-12 times/24 hours or sooner if feeding cues are present 2. Mom will pump after feedings, and will feed baby any amount of EBM she may get 3. Hand expression and spoon feeding were also encouraged  BF brochure, BF resources and feeding diary were reviewed. Parents reported all questions and concerns were answered, they're both aware of [place] services and will call PRN.  Maternal Data Formula Feeding for Exclusion: No Has patient been taught Hand Expression?: Yes Does the patient have breastfeeding experience prior to this delivery?: No  Feeding Feeding Type: Breast  Fed  LATCH Score Latch: Grasps breast easily, tongue down, lips flanged, rhythmical sucking.  Audible Swallowing: A few with stimulation  Type of Nipple: Everted at rest and after stimulation  Comfort (Breast/Nipple): Soft / non-tender  Hold (Positioning): Assistance needed to correctly position infant at breast and maintain latch.  LATCH Score: 8  Interventions Interventions: Breast feeding basics reviewed;Assisted with latch;Skin to skin;Breast massage;Hand express;Breast compression;Adjust position;Support pillows;Position options;DEBP  Lactation Tools Discussed/Used Tools: Pump Breast pump type: Double-Electric Breast Pump WIC Program: No Pump Review: Setup, frequency, and cleaning;Milk Storage Initiated by:: MPeck Date initiated:: 11/08/18   Consult Status Consult Status: Follow-up Date: 11/09/18 Follow-up type: In-patient    Martyna Thorns Francene Boyers 11/08/2018, 2:37 PM

## 2018-11-09 LAB — BIRTH TISSUE RECOVERY COLLECTION (PLACENTA DONATION)

## 2018-11-09 MED ORDER — IBUPROFEN 600 MG PO TABS: 600.0000 mg | ORAL_TABLET | Freq: Four times a day (QID) | ORAL | 0 refills | Status: DC

## 2018-11-09 MED ORDER — DOCUSATE SODIUM 100 MG PO CAPS: 100.0000 mg | ORAL_CAPSULE | Freq: Two times a day (BID) | ORAL | 0 refills | Status: DC

## 2018-11-09 NOTE — Discharge Summary (Signed)
OB Discharge Summary     Patient Name: Carol Singleton DOB: [birth year] MRN: [id number]  Date of admission: 11/07/2018 Delivering MD: Kathalene FramesGREER, ELLIS K   Date of discharge: 11/09/2018  Admitting diagnosis: INDUCTION Intrauterine pregnancy: 2267w2d     Secondary diagnosis:  Active Problems:   Labor and delivery, indication for care  Additional problems: none     Discharge diagnosis: Term Pregnancy Delivered                                                                                                Post partum procedures:n/a  Augmentation: AROM and Pitocin  Complications: None  Hospital course:  Induction of Labor With Vaginal Delivery   33 y.o. yo G1P1001 at 5367w2d was admitted to the hospital 11/07/2018 for induction of labor.  Indication for induction: non-reassuring antepartum fetal surveillance.  Patient had an uncomplicated labor course as follows: Membrane Rupture Time/Date: 5:59 PM ,11/07/2018   Intrapartum Procedures: Episiotomy: None [1]                                         Lacerations:  1st degree [2];Sulcus [9];Labial [10]  Patient had delivery of a Viable infant.  Information for the patient's newborn:  Evelina Dunurpin, Boy Avyanna [[id number]][[id number]]  Delivery Method: Vaginal, Spontaneous(Filed from Delivery Summary)    11/07/2018  Details of delivery can be found in separate delivery note.  Patient had a routine postpartum course. Patient is discharged home 11/09/18.  Physical exam  Vitals:   11/08/18 0500 11/08/18 1401 11/08/18 2130 11/09/18 0534  BP: 102/72 122/67 110/75 (!) 117/57  Pulse: 83 88 79 (!) 57  Resp: 18 18 18 18   Temp: 98 F (36.7 C) 98.1 F (36.7 C) 97.7 F (36.5 C) 98.2 F (36.8 C)  TempSrc: Oral Oral Oral Oral  SpO2: 100% 99% 97% 100%  Weight:      Height:       General: alert, cooperative and no distress Lochia: appropriate Uterine Fundus: firm Incision: N/A DVT Evaluation: No evidence of DVT seen on physical exam. Labs: Lab Results  Component Value  Date   WBC 13.4 (H) 11/08/2018   HGB 10.7 (L) 11/08/2018   HCT 30.9 (L) 11/08/2018   MCV 93.9 11/08/2018   PLT 132 (L) 11/08/2018   No flowsheet data found.  Discharge instruction: per After Visit Summary and "Baby and Me Booklet".  After visit meds:  Allergies as of 11/09/2018      Reactions   Latex Rash      Medication List    TAKE these medications   docusate sodium 100 MG capsule Commonly known as: Colace Take 1 capsule (100 mg total) by mouth 2 (two) times daily.   ibuprofen 600 MG tablet Commonly known as: ADVIL Take 1 tablet (600 mg total) by mouth every 6 (six) hours.   labetalol 100 MG tablet Commonly known as: NORMODYNE Take 50 mg by mouth 2 (two) times daily.       Diet: routine diet  Activity: Advance  as tolerated. Pelvic rest for 6 weeks.   Outpatient follow up:6 weeks Follow up Appt: Future Appointments  Date Time Provider [place]  11/11/2018  7:30 AM [facility] 1 [facility] None   Follow up Visit:No follow-ups on file.  Postpartum contraception: Undecided  Newborn Data: Live born female  Birth Weight: 8 lb 3.9 oz (3739 g) APGAR: 9, 9  Newborn Delivery   Birth date/time: 11/07/2018 21:15:00 Delivery type: Vaginal, Spontaneous      Baby Feeding: Breast Disposition:home with mother   11/09/2018 Annalee Genta, DO

## 2018-11-09 NOTE — Discharge Instructions (Signed)

## 2018-11-09 NOTE — Lactation Note (Signed)
This note was copied from a baby's chart. Lactation Consultation Note  Patient Name: Carol Singleton [id number] Date: 11/09/2018 Reason for consult: Follow-up assessment;Term;Primapara;1st time breastfeeding  P1 mother whose infant is now 54 hours old.    Baby was asleep in mother's arms when I arrived.  Mother feels like latching is getting better with practice.  Mother's breasts are soft and non tender and nipples are everted and intact.  Mother will continue to feed 8-12 times/24 hours or sooner if baby shows cues.  Engorgement prevention/treatment discussed.  Encouraged mother to also continue hand expression before/after feedings to help increase milk supply.  Mother is able to obtain colostrum drops with hand expression.  Provided a manual pump with instructions for use.  #24 flange size is appropriate at this time.  Mother has a DEBP for home use.  She also has our OP phone number for questions after discharge.  Father present.   Maternal Data Formula Feeding for Exclusion: No Has patient been taught Hand Expression?: Yes Does the patient have breastfeeding experience prior to this delivery?: No  Feeding Feeding Type: Breast Fed  LATCH Score Latch: Grasps breast easily, tongue down, lips flanged, rhythmical sucking.  Audible Swallowing: A few with stimulation  Type of Nipple: Everted at rest and after stimulation  Comfort (Breast/Nipple): Soft / non-tender  Hold (Positioning): Assistance needed to correctly position infant at breast and maintain latch.  LATCH Score: 8  Interventions    Lactation Tools Discussed/Used Pump Review: Setup, frequency, and cleaning Initiated by:: Kataleya Zaugg Date initiated:: 11/09/18   Consult Status Consult Status: Complete Date: 11/09/18 Follow-up type: Call as needed    Eran Windish R Demia Viera 11/09/2018, 8:49 AM

## 2018-11-11 ENCOUNTER — Other Ambulatory Visit (HOSPITAL_COMMUNITY)
Admission: RE | Admit: 2018-11-11 | Discharge: 2018-11-11 | Disposition: A | Discharge status: Home / Self Care | Payer: Managed Care, Other (non HMO) | Source: Ambulatory Visit

## 2018-11-11 HISTORY — DX: Unspecified abnormal cytological findings in specimens from vagina: R87.629

## 2018-11-13 ENCOUNTER — Inpatient Hospital Stay (HOSPITAL_COMMUNITY): Payer: Managed Care, Other (non HMO)

## 2018-11-13 ENCOUNTER — Inpatient Hospital Stay (HOSPITAL_COMMUNITY)
Admission: AD | Admit: 2018-11-13 | Payer: Managed Care, Other (non HMO) | Source: Home / Self Care | Admitting: Obstetrics & Gynecology

## 2019-08-24 ENCOUNTER — Other Ambulatory Visit: Payer: Self-pay | Admitting: Family Medicine

## 2019-08-24 DIAGNOSIS — N63 Unspecified lump in unspecified breast: Secondary | ICD-10-CM

## 2019-08-31 ENCOUNTER — Ambulatory Visit
Admission: RE | Admit: 2019-08-31 | Discharge: 2019-08-31 | Disposition: A | Discharge status: Home / Self Care | Payer: Managed Care, Other (non HMO) | Source: Ambulatory Visit | Attending: Family Medicine | Admitting: Family Medicine

## 2019-08-31 ENCOUNTER — Other Ambulatory Visit: Payer: Self-pay

## 2019-08-31 DIAGNOSIS — N63 Unspecified lump in unspecified breast: Secondary | ICD-10-CM

## 2021-01-17 IMAGING — MG DIGITAL DIAGNOSTIC BILAT W/ TOMO W/ CAD
6 of 10 series · 6 of 30 positions shown · non-contrast
Comparison: None.

CLINICAL DATA: 33-year-old breast-feeding female presents with a
right breast palpable lump for approximately 2 months. The patient
has been breast feeding for approximately 9 months.

EXAM:
DIGITAL DIAGNOSTIC BILATERAL MAMMOGRAM WITH CAD AND TOMO
ULTRASOUND RIGHT BREAST

[R MLO synth-2D]
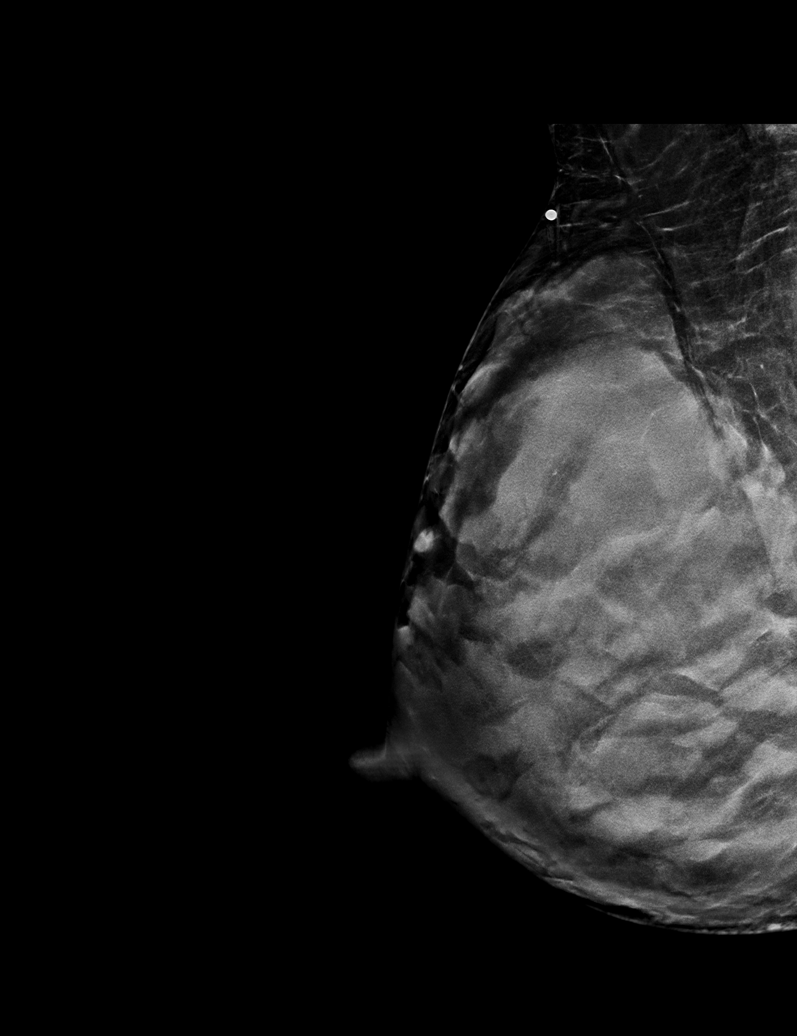

[R CC synth-2D]
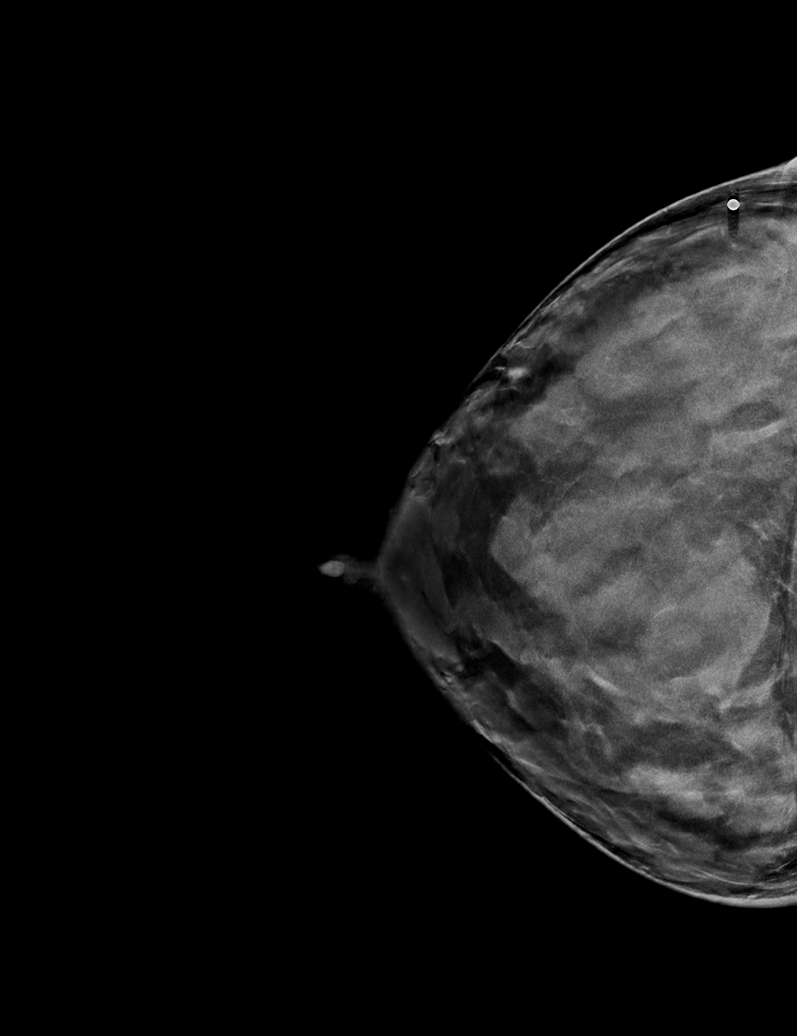

[L CC synth-2D]
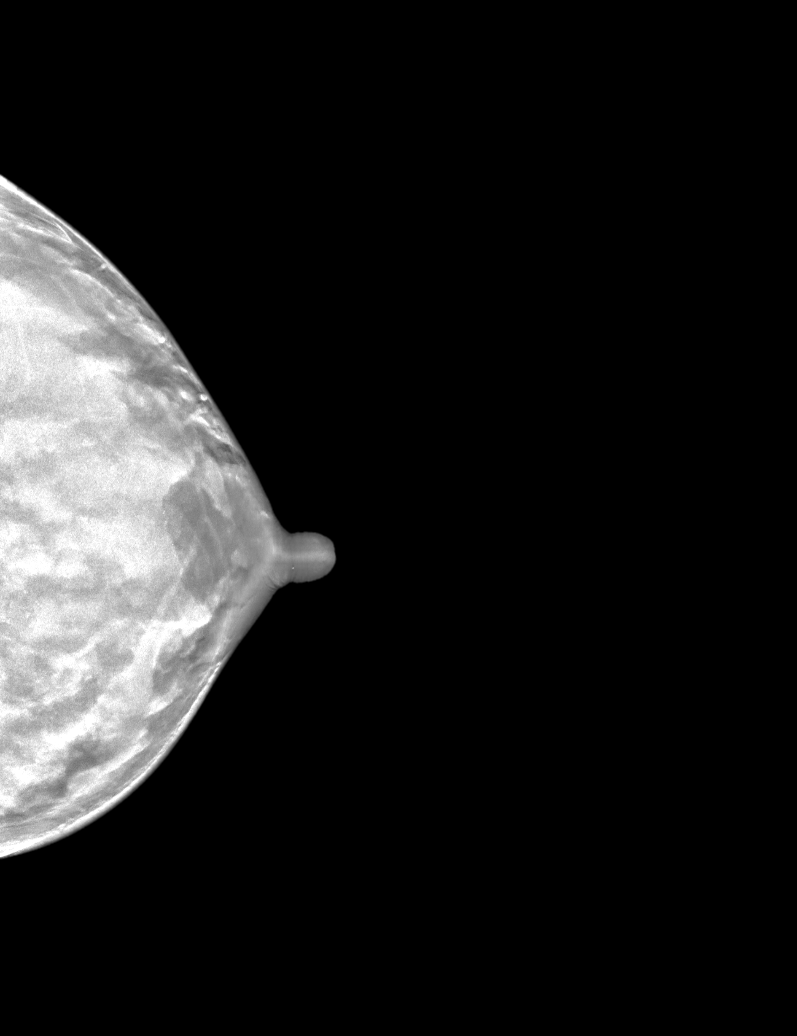

[L MLO synth-2D]
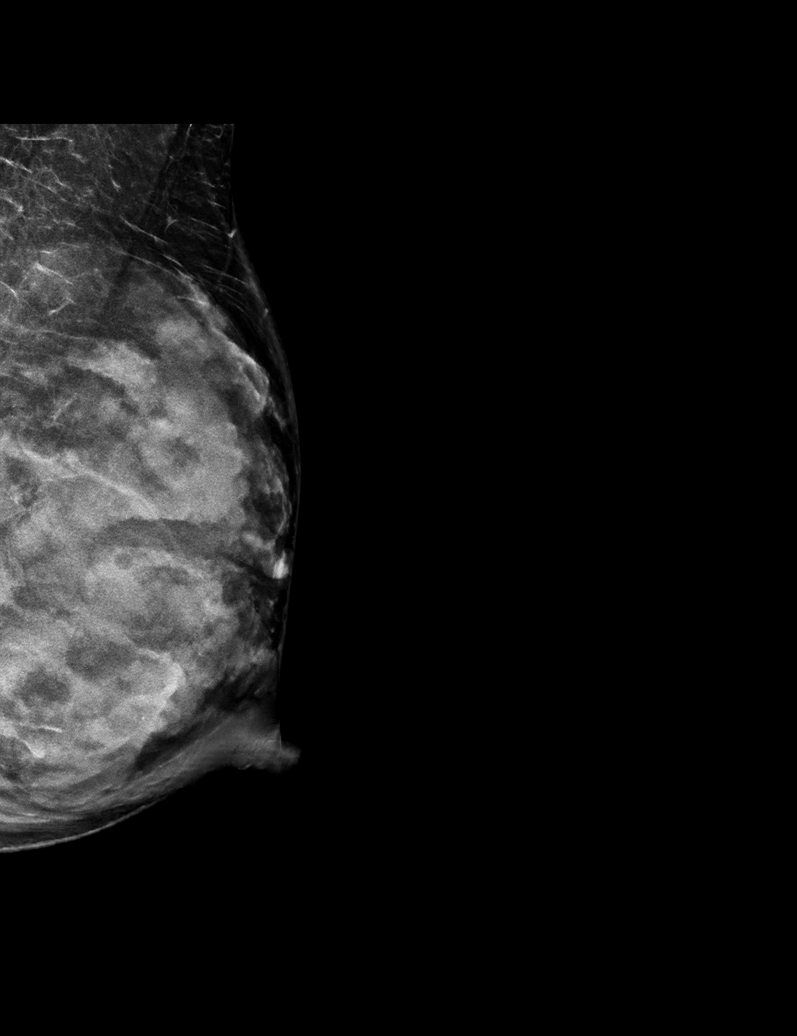

[R TAN synth-2D]
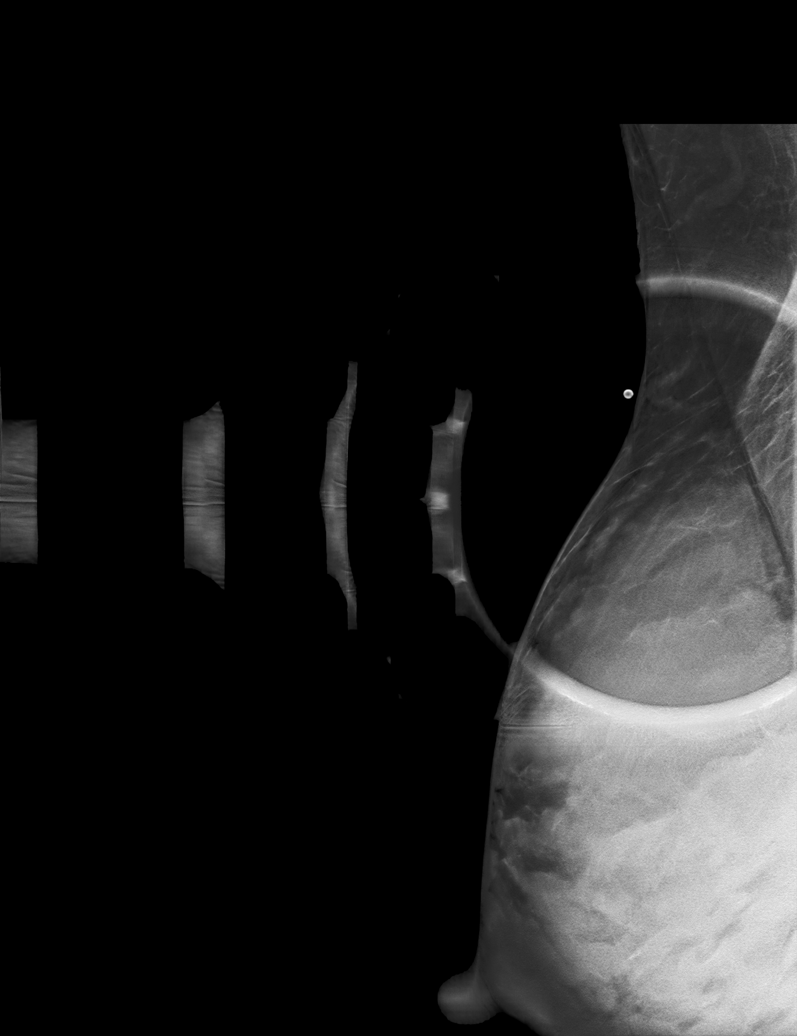

[L MLO tomo · tomo slice 32/63.0]
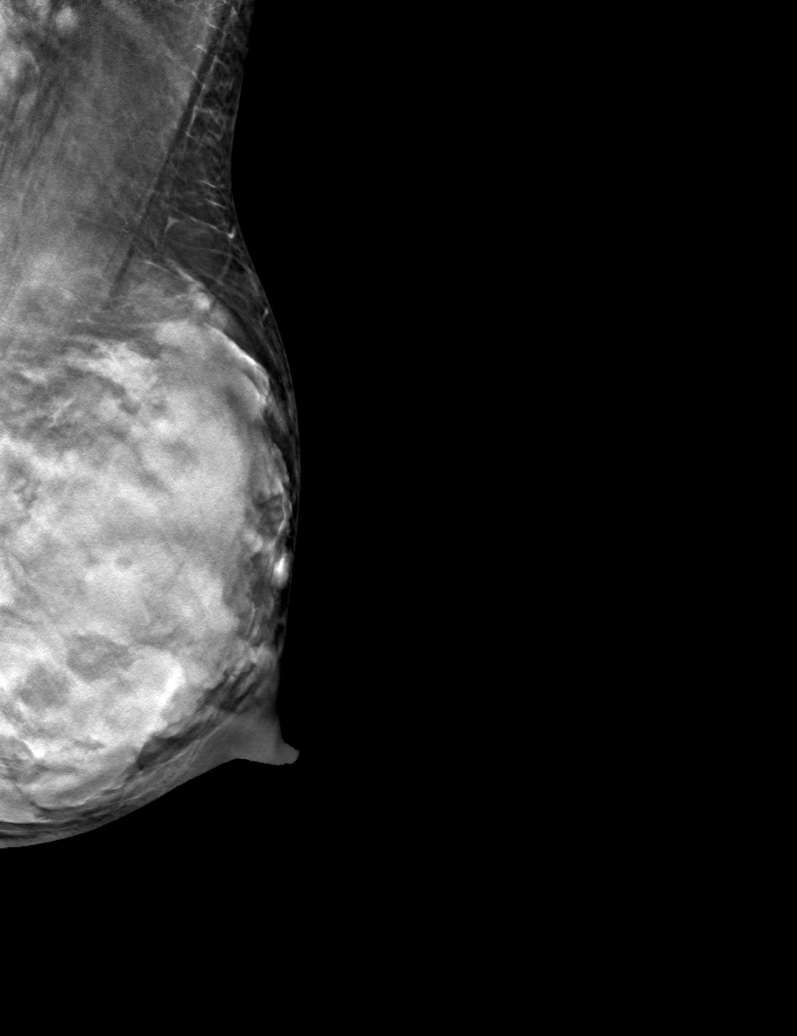

[6 of 30 positions shown; findings below may reference images not displayed]

ACR Breast Density Category d: The breast tissue is extremely dense,
which lowers the sensitivity of mammography.
FINDINGS: A radiopaque BB was placed at the site of the patient's palpable
lump in the far upper outer right breast. No focal or suspicious
mammographic findings are seen deep to the radiopaque BB.

No suspicious findings are identified in either breast.

Mammographic images were processed with CAD.

On physical exam, a very superficial, linear palpable area is
demonstrated in the upper outer right breast.

Targeted ultrasound is performed, showing a superficial vessel with
pulsatile blood flow at the 11 o'clock position of the right breast
along the low axillary tail. This corresponds with the patient's
palpable abnormality and is consistent with a normal blood vessel.
IMPRESSION: Benign superficial blood vessel corresponding with the patient's
right breast palpable abnormality. No further follow-up required.

RECOMMENDATION:
Screening mammogram at age 40 unless there are persistent or
intervening clinical concerns. (Code:KW-W-8EG)

I have discussed the findings and recommendations with the patient.
If applicable, a reminder letter will be sent to the patient
regarding the next appointment.

BI-RADS CATEGORY  2: Benign.

## 2021-03-03 ENCOUNTER — Inpatient Hospital Stay (HOSPITAL_COMMUNITY)
Admission: AD | Admit: 2021-03-03 | Discharge: 2021-03-03 | Disposition: A | Discharge status: Home / Self Care | Payer: Managed Care, Other (non HMO) | Attending: Obstetrics and Gynecology | Admitting: Obstetrics and Gynecology

## 2021-03-03 ENCOUNTER — Encounter (HOSPITAL_COMMUNITY): Payer: Self-pay | Admitting: *Deleted

## 2021-03-03 ENCOUNTER — Inpatient Hospital Stay (HOSPITAL_COMMUNITY): Payer: Managed Care, Other (non HMO)

## 2021-03-03 ENCOUNTER — Other Ambulatory Visit: Payer: Self-pay

## 2021-03-03 DIAGNOSIS — O039 Complete or unspecified spontaneous abortion without complication: Secondary | ICD-10-CM | POA: Insufficient documentation

## 2021-03-03 DIAGNOSIS — Z3A01 Less than 8 weeks gestation of pregnancy: Secondary | ICD-10-CM | POA: Insufficient documentation

## 2021-03-03 DIAGNOSIS — O99891 Other specified diseases and conditions complicating pregnancy: Secondary | ICD-10-CM | POA: Diagnosis present

## 2021-03-03 DIAGNOSIS — O09521 Supervision of elderly multigravida, first trimester: Secondary | ICD-10-CM | POA: Diagnosis not present

## 2021-03-03 DIAGNOSIS — O209 Hemorrhage in early pregnancy, unspecified: Secondary | ICD-10-CM

## 2021-03-03 HISTORY — DX: Urinary tract infection, site not specified: N39.0

## 2021-03-03 LAB — CBC
HCT: 36.4 % (ref 36.0–46.0)
Hemoglobin: 12.2 g/dL (ref 12.0–15.0)
MCH: 30.1 pg (ref 26.0–34.0)
MCHC: 33.5 g/dL (ref 30.0–36.0)
MCV: 89.9 fL (ref 80.0–100.0)
Platelets: 285 10*3/uL (ref 150–400)
RBC: 4.05 MIL/uL (ref 3.87–5.11)
RDW: 11.9 % (ref 11.5–15.5)
WBC: 9 10*3/uL (ref 4.0–10.5)
nRBC: 0 % (ref 0.0–0.2)

## 2021-03-03 LAB — POCT PREGNANCY, URINE: Preg Test, Ur: POSITIVE — AB

## 2021-03-03 LAB — ABO/RH: ABO/RH(D): A POS

## 2021-03-03 LAB — HCG, QUANTITATIVE, PREGNANCY: hCG, Beta Chain, Quant, S: 5458 m[IU]/mL — ABNORMAL HIGH (ref <5)

## 2021-03-03 NOTE — MAU Provider Note (Signed)
History     CSN: [id number]  Arrival date and time: 03/03/21 1523  None     Chief Complaint  Patient presents with   Back Pain   Vaginal Bleeding   HPI Carol Singleton is a 35 y.o. G2P1001 at [redacted]w[redacted]d here for vaginal bleeding. Patient reports bleeding started on Wednesday, initially started out as brown spotting, but is now heavier and bright red. Reports that she saturated the pad that she was wearing prior to arrival, but this was the first time this has happened. She denies passing any clots or tissue. Was seen in the office yesterday and was told everything looked good with the baby and that the baby had a normal heart rate. She has a picture on her phone from yesterday's ultrasound with her today. She also reports some mild abdominal cramping and low back pain, which worsened around 1-2pm today, but has since gotten better.   OB History     Gravida  2   Para  1   Term  1   Preterm      AB      Living  1      SAB      IAB      Ectopic      Multiple  0   Live Births  1           Past Medical History:  Diagnosis Date   Sinus tachycardia    UTI (urinary tract infection)    Vaginal Pap smear, abnormal    bx, colpo; repeat now fine    Past Surgical History:  Procedure Laterality Date   COLPOSCOPY     KNEE ARTHROSCOPY Left 2003   WISDOM TOOTH EXTRACTION      Family History  Problem Relation Age of Onset   Healthy Mother    Stroke Father 68       2009   Congestive Heart Failure Father    CVA Father    Heart disease Father    Bell's palsy Sister    Arrhythmia Sister    Heart attack Neg Hx     Social History   Tobacco Use   Smoking status: Never   Smokeless tobacco: Never  Vaping Use   Vaping Use: Never used  Substance Use Topics   Alcohol use: Not Currently   Drug use: No    Allergies:  Allergies  Allergen Reactions   Latex Rash    No medications prior to admission.    Review of Systems  Constitutional: Negative.   Respiratory:  Negative.    Cardiovascular: Negative.   Gastrointestinal:  Positive for abdominal pain and diarrhea. Negative for nausea and vomiting.  Genitourinary:  Positive for vaginal bleeding. Negative for dysuria and frequency.  Musculoskeletal:  Positive for back pain.  Neurological: Negative.   Physical Exam   Blood pressure 136/78, pulse (!) 110, temperature 98.4 F (36.9 C), temperature source Oral, resp. rate 18, height 5' 5.5" (1.664 m), weight 61.3 kg, last menstrual period 01/14/2021, SpO2 100 %, currently breastfeeding.  Physical Exam Vitals reviewed. Exam conducted with a chaperone present.  Constitutional:      General: She is not in acute distress.    Appearance: She is normal weight.  Eyes:     Pupils: Pupils are equal, round, and reactive to light.  Cardiovascular:     Rate and Rhythm: Normal rate.  Pulmonary:     Effort: Pulmonary effort is normal.  Abdominal:     Palpations: Abdomen is soft.  Tenderness: There is no abdominal tenderness.  Genitourinary:    Vagina: Bleeding present.     Cervix: Normal.     Comments: There is a small clot and what appears to be possible amniotic sac and POC in vagina Musculoskeletal:        General: Normal range of motion.  Skin:    General: Skin is warm and dry.  Neurological:     General: No focal deficit present.     Mental Status: She is alert and oriented to person, place, and time.  Psychiatric:        Mood and Affect: Mood normal.        Behavior: Behavior normal.        Thought Content: Thought content normal.        Judgment: Judgment normal.   US OB LESS THAN 14 WEEKS WITH OB TRANSVAGINAL  Result Date: 03/03/2021 CLINICAL DATA:  Vaginal bleeding EXAM: OBSTETRIC <14 WK [place] AND TRANSVAGINAL OB US TECHNIQUE: Both transabdominal and transvaginal ultrasound examinations were performed for complete evaluation of the gestation as well as the maternal uterus, adnexal regions, and pelvic cul-de-sac. Transvaginal technique was  performed to assess early pregnancy. COMPARISON:  None. FINDINGS: Intrauterine gestational sac: None Yolk sac:  Not Visualized. Embryo:  Not Visualized. Cardiac Activity: Not Visualized. Heart Rate: Not applicable MSD: Not applicable CRL:  Not applicable Subchorionic hemorrhage:  Not applicable. Maternal uterus/adnexae: Normal bilateral ovaries with corpus luteum in the left ovary. The endometrium appears thickened and heterogeneous measuring up to 16 mm. IMPRESSION: Pregnancy of unknown location in this patient with positive urine pregnancy test. No gestational sac visualized. Heterogeneous and thickened endometrium measuring up to 16 mm possibly with some blood products within the endometrial canal. Recommend trending quantitative beta hCGs, close clinical follow-up with OBGYN, and repeat ultrasound as clinically indicated. Electronically Signed   By: Caprice Renshaw M.D.   On: 03/03/2021 17:41    MAU Course  Procedures  MDM CBC, HCG, ABO/RH: A pos, rhogam not indicated [place]  Patient with IUP and FHR on ultrasound in office yesterday. Has picture on phone. Reviewed with Dr. Macon Large. Patient to follow up in office next week.   Assessment and Plan  Vaginal bleeding in pregnancy SAB   - Discharge in stable condition - Bleeding and strict return precautions reviewed. Return to MAU as needed - Patient to follow up at [facility] next week. Will contact Dr. Tenny Craw regarding follow up.    Brand Males, CNM 03/03/2021, 6:49 PM

## 2021-03-03 NOTE — MAU Note (Signed)
Presents with c/o VB that began on Wednesday, reports VB has increased since than, denies passing clots.  Also reports lower abdominal cramping.  Seen in office yesterday and U/S done. LMP 01/14/2021

## 2021-03-03 NOTE — MAU Note (Signed)
Viable [place] fh 104 noted yesterday. No reason was given for bleeding.

## 2021-03-07 LAB — SURGICAL PATHOLOGY

## 2021-04-23 NOTE — L&D Delivery Note (Signed)
Delivery Note Following amniotomy, patient progressed along a normal labor curve to 10/100/+1.  She pushed well for 10 minutes. At 9:45 PM a viable female was delivered via Vaginal, Spontaneous (Presentation: Left Occiput Anterior).  APGAR: 9, 9; weight 8lb 15.6 oz (4070 gm).   Placenta status: Spontaneous, Intact.  Cord: 3 vessels with the following complications: None.  Cord pH: n/a  Anesthesia: Epidural Episiotomy: None Lacerations: Superficial tear along perineal scar from prior laceration that was repaired Suture Repair: 3.0 vicryl rapide Est. Blood Loss (mL): 202  Mom to postpartum.  Baby to Couplet care / Skin to Skin.  [place] 01/28/2022, 10:07 PM

## 2021-07-11 LAB — OB RESULTS CONSOLE HEPATITIS B SURFACE ANTIGEN: Hepatitis B Surface Ag: NEGATIVE

## 2021-07-11 LAB — OB RESULTS CONSOLE RUBELLA ANTIBODY, IGM: Rubella: IMMUNE

## 2021-07-11 LAB — OB RESULTS CONSOLE ABO/RH: RH Type: POSITIVE

## 2021-07-11 LAB — OB RESULTS CONSOLE GC/CHLAMYDIA
Chlamydia: NEGATIVE
Neisseria Gonorrhea: NEGATIVE

## 2021-07-11 LAB — OB RESULTS CONSOLE HIV ANTIBODY (ROUTINE TESTING): HIV: NONREACTIVE

## 2021-07-11 LAB — OB RESULTS CONSOLE ANTIBODY SCREEN: Antibody Screen: NEGATIVE

## 2021-07-11 LAB — HEPATITIS C ANTIBODY: HCV Ab: NEGATIVE

## 2021-07-11 LAB — OB RESULTS CONSOLE RPR: RPR: NONREACTIVE

## 2021-07-23 ENCOUNTER — Other Ambulatory Visit: Payer: Self-pay

## 2021-07-23 ENCOUNTER — Inpatient Hospital Stay (HOSPITAL_COMMUNITY)
Admission: AD | Admit: 2021-07-23 | Discharge: 2021-07-23 | Disposition: A | Discharge status: Home / Self Care | Payer: Managed Care, Other (non HMO) | Attending: Obstetrics and Gynecology | Admitting: Obstetrics and Gynecology

## 2021-07-23 ENCOUNTER — Encounter (HOSPITAL_COMMUNITY): Payer: Self-pay | Admitting: Obstetrics and Gynecology

## 2021-07-23 DIAGNOSIS — K529 Noninfective gastroenteritis and colitis, unspecified: Secondary | ICD-10-CM | POA: Insufficient documentation

## 2021-07-23 DIAGNOSIS — O99611 Diseases of the digestive system complicating pregnancy, first trimester: Secondary | ICD-10-CM | POA: Insufficient documentation

## 2021-07-23 DIAGNOSIS — O219 Vomiting of pregnancy, unspecified: Secondary | ICD-10-CM | POA: Diagnosis present

## 2021-07-23 DIAGNOSIS — Z3A13 13 weeks gestation of pregnancy: Secondary | ICD-10-CM | POA: Insufficient documentation

## 2021-07-23 LAB — URINALYSIS, ROUTINE W REFLEX MICROSCOPIC
Bilirubin Urine: NEGATIVE
Glucose, UA: NEGATIVE mg/dL
Hgb urine dipstick: NEGATIVE
Ketones, ur: 80 mg/dL — AB
Leukocytes,Ua: NEGATIVE
Nitrite: NEGATIVE
Protein, ur: NEGATIVE mg/dL
Specific Gravity, Urine: 1.03 (ref 1.005–1.030)
pH: 5 (ref 5.0–8.0)

## 2021-07-23 LAB — POCT PREGNANCY, URINE: Preg Test, Ur: POSITIVE — AB

## 2021-07-23 LAB — COMPREHENSIVE METABOLIC PANEL
ALT: 12 U/L (ref 0–44)
AST: 25 U/L (ref 15–41)
Albumin: 3.7 g/dL (ref 3.5–5.0)
Alkaline Phosphatase: 41 U/L (ref 38–126)
Anion gap: 9 (ref 5–15)
BUN: 8 mg/dL (ref 6–20)
CO2: 22 mmol/L (ref 22–32)
Calcium: 8.7 mg/dL — ABNORMAL LOW (ref 8.9–10.3)
Chloride: 103 mmol/L (ref 98–111)
Creatinine, Ser: 0.62 mg/dL (ref 0.44–1.00)
GFR, Estimated: >60 mL/min (ref >60)
Glucose, Bld: 120 mg/dL — ABNORMAL HIGH (ref 70–99)
Potassium: 3.4 mmol/L — ABNORMAL LOW (ref 3.5–5.1)
Sodium: 134 mmol/L — ABNORMAL LOW (ref 135–145)
Total Bilirubin: 1.3 mg/dL — ABNORMAL HIGH (ref 0.3–1.2)
Total Protein: 6.9 g/dL (ref 6.5–8.1)

## 2021-07-23 MED ORDER — ONDANSETRON 4 MG PO TBDP: 4.0000 mg | ORAL_TABLET | Freq: Four times a day (QID) | ORAL | 0 refills | Status: AC | PRN

## 2021-07-23 MED ORDER — LACTATED RINGERS IV BOLUS
1000.0000 mL | Freq: Once | INTRAVENOUS | Status: AC
  Administered 2021-07-23: 1000 mL via INTRAVENOUS

## 2021-07-23 MED ORDER — SODIUM CHLORIDE 0.9 % IV SOLN
8.0000 mg | Freq: Once | INTRAVENOUS | Status: AC
  Administered 2021-07-23: 8 mg via INTRAVENOUS
  Filled 2021-07-23: qty 4

## 2021-07-23 MED ORDER — FAMOTIDINE 20 MG PO TABS: 20.0000 mg | ORAL_TABLET | Freq: Two times a day (BID) | ORAL | 0 refills | Status: AC

## 2021-07-23 MED ORDER — LOPERAMIDE HCL 2 MG PO CAPS: 2.0000 mg | ORAL_CAPSULE | Freq: Four times a day (QID) | ORAL | 0 refills | Status: AC | PRN

## 2021-07-23 NOTE — MAU Provider Note (Addendum)
?History  ?  ? ?CSN: [id number] ? ?Arrival date and time: 07/23/21 Carol Singleton ? ? Event Date/Time  ? First Provider Initiated Contact with Patient 07/23/21 1904   ?  ? ?Chief Complaint  ?Patient presents with  ? Nausea  ? Diarrhea  ? ?HPI ? ?Carol Singleton is a 36 y.o. female G33P1011 @ [redacted]w[redacted]d here in MAU with complaints of N/V/D. Symptoms started last night around 11:30 pm. Her young child is in daycare and has the same symptoms. She has vomited over 11 times, and has had 5 episodes of diarrhea. She denies fever or abdominal pain.  ? ?OB History   ? ? Gravida  ?3  ? Para  ?1  ? Term  ?1  ? Preterm  ?   ? AB  ?1  ? Living  ?1  ?  ? ? SAB  ?1  ? IAB  ?   ? Ectopic  ?   ? Multiple  ?0  ? Live Births  ?1  ?   ?  ?  ? ? ?Past Medical History:  ?Diagnosis Date  ? Sinus tachycardia   ? UTI (urinary tract infection)   ? Vaginal Pap smear, abnormal   ? bx, colpo; repeat now fine  ? ? ?Past Surgical History:  ?Procedure Laterality Date  ? COLPOSCOPY    ? KNEE ARTHROSCOPY Left 2003  ? WISDOM TOOTH EXTRACTION    ? ? ?Family History  ?Problem Relation Age of Onset  ? Healthy Mother   ? Stroke Father 24  ?     2009  ? Congestive Heart Failure Father   ? CVA Father   ? Heart disease Father   ? Bell's palsy Sister   ? Arrhythmia Sister   ? Heart attack Neg Hx   ? ? ?Social History  ? ?Tobacco Use  ? Smoking status: Never  ? Smokeless tobacco: Never  ?Vaping Use  ? Vaping Use: Never used  ?Substance Use Topics  ? Alcohol use: Not Currently  ? Drug use: No  ? ? ?Allergies:  ?Allergies  ?Allergen Reactions  ? Latex Rash  ? ? ?Medications Prior to Admission  ?Medication Sig Dispense Refill Last Dose  ? calcium carbonate (TUMS - DOSED IN MG ELEMENTAL CALCIUM) 500 MG chewable tablet Chew 1-2 tablets by mouth 2 (two) times daily as needed for indigestion or heartburn.     ? docusate sodium (COLACE) 100 MG capsule Take 1 capsule (100 mg total) by mouth 2 (two) times daily. 60 capsule 0   ? ibuprofen (ADVIL) 600 MG tablet Take 1 tablet (600 mg  total) by mouth every 6 (six) hours. 30 tablet 0   ? Omega-3 Fatty Acids (FISH OIL PO) Take 1 capsule by mouth daily.     ? Prenatal Vit-Fe Fumarate-FA (PRENATAL MULTIVITAMIN) TABS tablet Take 1 tablet by mouth daily at 12 noon.     ? ?Results for orders placed or performed during the hospital encounter of 07/23/21 (from the past 48 hour(s))  ?Pregnancy, urine POC     Status: Abnormal  ? Collection Time: 07/23/21  6:38 PM  ?Result Value Ref Range  ? Preg Test, Ur POSITIVE (A) NEGATIVE  ?  Comment:        ?THE SENSITIVITY OF THIS ?METHODOLOGY IS >24 mIU/mL ?  ?  ? ?Review of Systems  ?Gastrointestinal:  Positive for diarrhea, nausea and vomiting. Negative for abdominal pain.  ?Genitourinary:  Negative for vaginal bleeding.  ?Physical Exam  ? ?Blood pressure 117/67,  pulse (!) 124, temperature 99 ?F (37.2 ?C), temperature source Oral, resp. rate 18, height 5' 5.5" (1.664 m), weight 62.1 kg, last menstrual period 01/14/2021, SpO2 100 %, unknown if currently breastfeeding. ? ?Physical Exam ?Constitutional:   ?   General: She is not in acute distress. ?   Appearance: Normal appearance. She is ill-appearing. She is not toxic-appearing or diaphoretic.  ?Musculoskeletal:     ?   General: Normal range of motion.  ?Neurological:  ?   Mental Status: She is alert and oriented to person, place, and time.  ?Psychiatric:     ?   Mood and Affect: Mood normal.  ? ?MAU Course  ?Procedures ?None ? ?MDM ? ?+ fetal heart tones.  ?LR bolus X 2 ?Zofran 8 mg IV  ?Report given to Gerrit Heck CNM who resumes care of the patient.  ? ?Assessment and Plan  ?Reassessment (9:03 PM) ? ?Patient give PO fluids for challenge. ?Will send script for zofran and imodium to pharmacy ?Follow up as scheduled. ?Return as necessary ? ? ?Cherre Robins MSN, CNM ?Advanced Practice Provider, [facility] ? ?

## 2021-07-23 NOTE — MAU Note (Signed)
Carol Singleton is a 36 y.o. at [redacted]w[redacted]d here in MAU reporting: states a stomach bug has been running through her house. Started vomiting last night around 2130 and has had about 11 episodes of emesis. 5-6 episodes of diarrhea. Feeling very weak. ? ?Onset of complaint: last night ? ?Pain score: 0/10 ? ?Vitals:  ? 07/23/21 1841  ?BP: 117/67  ?Pulse: (!) 124  ?Resp: 18  ?Temp: 99 ?F (37.2 ?C)  ?SpO2: 100%  ?   ?FHT:167 ? ?Lab orders placed from triage: UA, UPT ? ?

## 2021-08-05 ENCOUNTER — Other Ambulatory Visit: Payer: Self-pay | Admitting: Advanced Practice Midwife

## 2022-01-04 LAB — OB RESULTS CONSOLE GBS: GBS: NEGATIVE

## 2022-01-18 ENCOUNTER — Telehealth (HOSPITAL_COMMUNITY): Payer: Self-pay | Admitting: *Deleted

## 2022-01-18 NOTE — Telephone Encounter (Signed)
Preadmission screen  

## 2022-01-19 ENCOUNTER — Encounter (HOSPITAL_COMMUNITY): Payer: Self-pay

## 2022-01-24 ENCOUNTER — Encounter (HOSPITAL_COMMUNITY): Payer: Self-pay | Admitting: *Deleted

## 2022-01-24 ENCOUNTER — Telehealth (HOSPITAL_COMMUNITY): Payer: Self-pay | Admitting: *Deleted

## 2022-01-24 NOTE — Telephone Encounter (Signed)
Preadmission screen  

## 2022-01-28 ENCOUNTER — Inpatient Hospital Stay (HOSPITAL_COMMUNITY)
Admission: AD | Admit: 2022-01-28 | Discharge: 2022-01-30 | DRG: 807 | Disposition: A | Discharge status: Home / Self Care | Payer: Managed Care, Other (non HMO) | Attending: Obstetrics | Admitting: Obstetrics

## 2022-01-28 ENCOUNTER — Inpatient Hospital Stay (HOSPITAL_COMMUNITY): Payer: Managed Care, Other (non HMO) | Admitting: Anesthesiology

## 2022-01-28 ENCOUNTER — Encounter (HOSPITAL_COMMUNITY): Payer: Self-pay | Admitting: Obstetrics

## 2022-01-28 ENCOUNTER — Other Ambulatory Visit: Payer: Self-pay

## 2022-01-28 DIAGNOSIS — O480 Post-term pregnancy: Secondary | ICD-10-CM | POA: Diagnosis present

## 2022-01-28 DIAGNOSIS — Z3A40 40 weeks gestation of pregnancy: Secondary | ICD-10-CM | POA: Diagnosis not present

## 2022-01-28 LAB — CBC
HCT: 36.3 % (ref 36.0–46.0)
Hemoglobin: 12.9 g/dL (ref 12.0–15.0)
MCH: 32.9 pg (ref 26.0–34.0)
MCHC: 35.5 g/dL (ref 30.0–36.0)
MCV: 92.6 fL (ref 80.0–100.0)
Platelets: 180 10*3/uL (ref 150–400)
RBC: 3.92 MIL/uL (ref 3.87–5.11)
RDW: 13.7 % (ref 11.5–15.5)
WBC: 9.1 10*3/uL (ref 4.0–10.5)
nRBC: 0 % (ref 0.0–0.2)

## 2022-01-28 LAB — TYPE AND SCREEN
ABO/RH(D): A POS
Antibody Screen: NEGATIVE

## 2022-01-28 MED ORDER — DIPHENHYDRAMINE HCL 50 MG/ML IJ SOLN: 12.5000 mg | INTRAMUSCULAR | Status: DC | PRN

## 2022-01-28 MED ORDER — LACTATED RINGERS IV SOLN: 500.0000 mL | INTRAVENOUS | Status: DC | PRN

## 2022-01-28 MED ORDER — SIMETHICONE 80 MG PO CHEW: 80.0000 mg | CHEWABLE_TABLET | ORAL | Status: DC | PRN

## 2022-01-28 MED ORDER — EPHEDRINE 5 MG/ML INJ: 10.0000 mg | INTRAVENOUS | Status: DC | PRN

## 2022-01-28 MED ORDER — ONDANSETRON HCL 4 MG/2ML IJ SOLN: 4.0000 mg | INTRAMUSCULAR | Status: DC | PRN

## 2022-01-28 MED ORDER — IBUPROFEN 600 MG PO TABS
600.0000 mg | ORAL_TABLET | Freq: Four times a day (QID) | ORAL | Status: DC
  Administered 2022-01-29 – 2022-01-30 (×7): 600 mg via ORAL
  Filled 2022-01-28 (×7): qty 1

## 2022-01-28 MED ORDER — PHENYLEPHRINE 80 MCG/ML (10ML) SYRINGE FOR IV PUSH (FOR BLOOD PRESSURE SUPPORT): 80.0000 ug | PREFILLED_SYRINGE | INTRAVENOUS | Status: DC | PRN

## 2022-01-28 MED ORDER — LACTATED RINGERS IV SOLN: INTRAVENOUS | Status: DC

## 2022-01-28 MED ORDER — PRENATAL MULTIVITAMIN CH
1.0000 | ORAL_TABLET | Freq: Every day | ORAL | Status: DC
  Administered 2022-01-29 – 2022-01-30 (×2): 1 via ORAL
  Filled 2022-01-28 (×2): qty 1

## 2022-01-28 MED ORDER — ONDANSETRON HCL 4 MG PO TABS: 4.0000 mg | ORAL_TABLET | ORAL | Status: DC | PRN

## 2022-01-28 MED ORDER — LACTATED RINGERS IV SOLN: 500.0000 mL | Freq: Once | INTRAVENOUS | Status: DC

## 2022-01-28 MED ORDER — FENTANYL-BUPIVACAINE-NACL 0.5-0.125-0.9 MG/250ML-% EP SOLN
12.0000 mL/h | EPIDURAL | Status: DC | PRN
  Filled 2022-01-28: qty 250

## 2022-01-28 MED ORDER — OXYCODONE HCL 5 MG PO TABS: 10.0000 mg | ORAL_TABLET | ORAL | Status: DC | PRN

## 2022-01-28 MED ORDER — SOD CITRATE-CITRIC ACID 500-334 MG/5ML PO SOLN: 30.0000 mL | ORAL | Status: DC | PRN

## 2022-01-28 MED ORDER — ACETAMINOPHEN 325 MG PO TABS: 650.0000 mg | ORAL_TABLET | ORAL | Status: DC | PRN

## 2022-01-28 MED ORDER — LIDOCAINE HCL (PF) 1 % IJ SOLN: 30.0000 mL | INTRAMUSCULAR | Status: DC | PRN

## 2022-01-28 MED ORDER — OXYTOCIN BOLUS FROM INFUSION
333.0000 mL | Freq: Once | INTRAVENOUS | Status: AC
  Administered 2022-01-28: 333 mL via INTRAVENOUS

## 2022-01-28 MED ORDER — COCONUT OIL OIL: 1.0000 | TOPICAL_OIL | Status: DC | PRN

## 2022-01-28 MED ORDER — OXYTOCIN-SODIUM CHLORIDE 30-0.9 UT/500ML-% IV SOLN
2.5000 [IU]/h | INTRAVENOUS | Status: DC
  Filled 2022-01-28: qty 500

## 2022-01-28 MED ORDER — DIPHENHYDRAMINE HCL 25 MG PO CAPS: 25.0000 mg | ORAL_CAPSULE | Freq: Four times a day (QID) | ORAL | Status: DC | PRN

## 2022-01-28 MED ORDER — BENZOCAINE-MENTHOL 20-0.5 % EX AERO
1.0000 | INHALATION_SPRAY | CUTANEOUS | Status: DC | PRN
  Administered 2022-01-29: 1 via TOPICAL
  Filled 2022-01-28: qty 56

## 2022-01-28 MED ORDER — WITCH HAZEL-GLYCERIN EX PADS: 1.0000 | MEDICATED_PAD | CUTANEOUS | Status: DC | PRN

## 2022-01-28 MED ORDER — OXYCODONE-ACETAMINOPHEN 5-325 MG PO TABS: 1.0000 | ORAL_TABLET | ORAL | Status: DC | PRN

## 2022-01-28 MED ORDER — ONDANSETRON HCL 4 MG/2ML IJ SOLN: 4.0000 mg | Freq: Four times a day (QID) | INTRAMUSCULAR | Status: DC | PRN

## 2022-01-28 MED ORDER — OXYCODONE-ACETAMINOPHEN 5-325 MG PO TABS: 2.0000 | ORAL_TABLET | ORAL | Status: DC | PRN

## 2022-01-28 MED ORDER — SENNOSIDES-DOCUSATE SODIUM 8.6-50 MG PO TABS
2.0000 | ORAL_TABLET | ORAL | Status: DC
  Administered 2022-01-29: 2 via ORAL
  Filled 2022-01-28: qty 2

## 2022-01-28 MED ORDER — LIDOCAINE HCL (PF) 1 % IJ SOLN
INTRAMUSCULAR | Status: DC | PRN
  Administered 2022-01-28: 5 mL via EPIDURAL

## 2022-01-28 MED ORDER — OXYCODONE HCL 5 MG PO TABS: 5.0000 mg | ORAL_TABLET | ORAL | Status: DC | PRN

## 2022-01-28 MED ORDER — FENTANYL CITRATE (PF) 100 MCG/2ML IJ SOLN: 50.0000 ug | INTRAMUSCULAR | Status: DC | PRN

## 2022-01-28 MED ORDER — DIBUCAINE (PERIANAL) 1 % EX OINT: 1.0000 | TOPICAL_OINTMENT | CUTANEOUS | Status: DC | PRN

## 2022-01-28 MED ORDER — FENTANYL-BUPIVACAINE-NACL 0.5-0.125-0.9 MG/250ML-% EP SOLN
EPIDURAL | Status: DC | PRN
  Administered 2022-01-28: 12 mL/h via EPIDURAL

## 2022-01-28 MED ORDER — TETANUS-DIPHTH-ACELL PERTUSSIS 5-2.5-18.5 LF-MCG/0.5 IM SUSY: 0.5000 mL | PREFILLED_SYRINGE | Freq: Once | INTRAMUSCULAR | Status: DC

## 2022-01-28 NOTE — Anesthesia Preprocedure Evaluation (Addendum)
Anesthesia Evaluation  Patient identified by MRN, date of birth, ID band Patient awake    Reviewed: Allergy & Precautions, NPO status , Patient's Chart, lab work & pertinent test results  Airway Mallampati: II  TM Distance: >3 FB Neck ROM: Full    Dental no notable dental hx. (+) Teeth Intact   Pulmonary neg pulmonary ROS,    Pulmonary exam normal breath sounds clear to auscultation       Cardiovascular Exercise Tolerance: Good Normal cardiovascular exam Rhythm:Regular Rate:Normal  Hx of SVT in past not currently being treated   Neuro/Psych negative neurological ROS     GI/Hepatic negative GI ROS, Neg liver ROS,   Endo/Other  negative endocrine ROS  Renal/GU negative Renal ROS     Musculoskeletal   Abdominal   Peds  Hematology Lab Results      Component                Value               Date                      WBC                      9.1                 01/28/2022                HGB                      12.9                01/28/2022                HCT                      36.3                01/28/2022                MCV                      92.6                01/28/2022                PLT                      180                 01/28/2022              Anesthesia Other Findings All: Latex  Reproductive/Obstetrics (+) Pregnancy                            Anesthesia Physical Anesthesia Plan  ASA: 2  Anesthesia Plan: Epidural   Post-op Pain Management:    Induction:   PONV Risk Score and Plan:   Airway Management Planned:   Additional Equipment:   Intra-op Plan:   Post-operative Plan:   Informed Consent: I have reviewed the patients History and Physical, chart, labs and discussed the procedure including the risks, benefits and alternatives for the proposed anesthesia with the patient or authorized representative who has indicated his/her understanding and acceptance.        Plan Discussed with:  Anesthesia Plan Comments: (40.1 Wk G3P1 for LEA)       Anesthesia Quick Evaluation

## 2022-01-28 NOTE — Anesthesia Procedure Notes (Signed)
Epidural Patient location during procedure: OB Start time: 01/28/2022 7:43 PM End time: 01/28/2022 7:56 PM  Staffing Anesthesiologist: Barnet Glasgow, MD Performed: anesthesiologist   Preanesthetic Checklist Completed: patient identified, IV checked, site marked, risks and benefits discussed, surgical consent, monitors and equipment checked, pre-op evaluation and timeout performed  Epidural Patient position: sitting Prep: DuraPrep and site prepped and draped Patient monitoring: continuous pulse ox and blood pressure Approach: midline Location: L3-L4 Injection technique: LOR air  Needle:  Needle type: Tuohy  Needle gauge: 17 G Needle length: 9 cm and 9 Needle insertion depth: 5 cm Catheter type: closed end flexible Catheter size: 19 Gauge Catheter at skin depth: 11 cm Test dose: negative  Assessment Events: blood not aspirated, injection not painful, no injection resistance, no paresthesia and negative IV test  Additional Notes Patient identified. Risks/Benefits/Options discussed with patient including but not limited to bleeding, infection, nerve damage, paralysis, failed block, incomplete pain control, headache, blood pressure changes, nausea, vomiting, reactions to medication both or allergic, itching and postpartum back pain. Confirmed with bedside nurse the patient's most recent platelet count. Confirmed with patient that they are not currently taking any anticoagulation, have any bleeding history or any family history of bleeding disorders. Patient expressed understanding and wished to proceed. All questions were answered. Sterile technique was used throughout the entire procedure. Please see nursing notes for vital signs. Test dose was given through epidural needle and negative prior to continuing to dose epidural or start infusion. Warning signs of high block given to the patient including shortness of breath, tingling/numbness in hands, complete motor block, or any  concerning symptoms with instructions to call for help. Patient was given instructions on fall risk and not to get out of bed. All questions and concerns addressed with instructions to call with any issues. 1  Attempt (S) . Patient tolerated procedure well.

## 2022-01-28 NOTE — MAU Note (Signed)
.  Carol Singleton is a 36 y.o. at [redacted]w[redacted]d here in MAU reporting: ctx q 3-10 min for the last several hours.  Denies LOF +FM   Onset of complaint: 10/7 Pain score: 5/10 Vitals:   01/28/22 1504  BP: 115/68  Pulse: (!) 105  Resp: 16  Temp: 98.3 F (36.8 C)  SpO2: 100%     FHT:150 Lab orders placed from triage:

## 2022-01-28 NOTE — H&P (Signed)
36 y.o. G3P1011 @ [redacted]w[redacted]d presents with contractions.  Otherwise has good fetal movement and no bleeding.    Pregnancy complicated by: Advanced maternal age:  low risk NIPT History of SVT: has been off of beta blockers for last 3-4 years  Past Medical History:  Diagnosis Date   Sinus tachycardia    UTI (urinary tract infection)    Vaginal Pap smear, abnormal    bx, colpo; repeat now fine    Past Surgical History:  Procedure Laterality Date   COLPOSCOPY     KNEE ARTHROSCOPY Left 2003   WISDOM TOOTH EXTRACTION      OB History  Gravida Para Term Preterm AB Living  3 1 1   1 1   SAB IAB Ectopic Multiple Live Births  1     0 1    # Outcome Date GA Lbr Len/2nd Weight Sex Delivery Anes PTL Lv  3 Current           2 SAB 02/2021     SAB     1 Term 11/07/18 [redacted]w[redacted]d 00:35 / 02:40 3739 g M Vag-Spont EPI  LIV     Birth Comments: bruising on face    Social History   Socioeconomic History   Marital status: Married    Spouse name: Not on file   Number of children: Not on file   Years of education: 18   Highest education level: Master's degree (e.g., MA, MS, MEng, MEd, MSW, MBA)  Occupational History   Occupation: works for [employer]  Tobacco Use   Smoking status: Never   Smokeless tobacco: Never  Vaping Use   Vaping Use: Never used  Substance and Sexual Activity   Alcohol use: Not Currently   Drug use: No   Sexual activity: Not Currently    Birth control/protection: None  Other Topics Concern   Not on file  Social History Narrative   Lives with husband in a 2 story home.  No children.  Works for [employer].  Education: Masters.     Social Determinants of Health   Financial Resource Strain: Low Risk  (11/05/2018)   Overall Financial Resource Strain (CARDIA)    Difficulty of Paying Living Expenses: Not hard at all  Food Insecurity: No Food Insecurity (01/28/2022)   Hunger Vital Sign    Worried About Running Out of Food in the Last Year: Never true    Ran Out of Food in  the Last Year: Never true  Transportation Needs: No Transportation Needs (01/28/2022)   PRAPARE - Hydrologist (Medical): No    Lack of Transportation (Non-Medical): No  Physical Activity: Not on file  Stress: Stress Concern Present (11/05/2018)   [place]    Feeling of Stress : To some extent  Social Connections: Not on file  Intimate Partner Violence: Not At Risk (01/28/2022)   Humiliation, Afraid, Rape, and Kick questionnaire    Fear of Current or Ex-Partner: No    Emotionally Abused: No    Physically Abused: No    Sexually Abused: No   Latex    Prenatal Transfer Tool  Maternal Diabetes: No Genetic Screening: Normal Maternal Ultrasounds/Referrals: Normal Fetal Ultrasounds or other Referrals:  None Maternal Substance Abuse:  No Significant Maternal Medications:  None Significant Maternal Lab Results: Group B Strep negative  ABO, Rh: A/Positive/-- (03/21 0000) Antibody: Negative (03/21 0000) Rubella: Immune (03/21 0000) RPR: Nonreactive (03/21 0000)  HBsAg: Negative (03/21 0000)  HIV: Non-reactive (03/21 0000)  GBS: Negative/-- (09/14 0000)     Vitals:   01/28/22 1504  BP: 115/68  Pulse: (!) 105  Resp: 16  Temp: 98.3 F (36.8 C)  SpO2: 100%     General:  NAD Abdomen:  soft, gravid, EFW 8# Ex:  no edema SVE:  5/70/-2/cephalic, AROM clear fluid FHTs:  130s, moderate variability, category 1 Toco:  not tracing well, on birthing ball, about q3-5 minutes   A/P   36 y.o. [id number] [redacted]w[redacted]d presents with labor Progressing without augmentation.  Will add pitocin as needed Anticipate SVD GBS negative    Shon Indelicato GEFFEL Carrol Hougland

## 2022-01-29 ENCOUNTER — Inpatient Hospital Stay (HOSPITAL_COMMUNITY): Payer: Managed Care, Other (non HMO)

## 2022-01-29 ENCOUNTER — Inpatient Hospital Stay (HOSPITAL_COMMUNITY)
Admission: AD | Admit: 2022-01-29 | Payer: Managed Care, Other (non HMO) | Source: Home / Self Care | Admitting: Obstetrics and Gynecology

## 2022-01-29 LAB — RPR: RPR Ser Ql: NONREACTIVE

## 2022-01-29 LAB — CBC
HCT: 32 % — ABNORMAL LOW (ref 36.0–46.0)
Hemoglobin: 11.2 g/dL — ABNORMAL LOW (ref 12.0–15.0)
MCH: 32.6 pg (ref 26.0–34.0)
MCHC: 35 g/dL (ref 30.0–36.0)
MCV: 93 fL (ref 80.0–100.0)
Platelets: 166 10*3/uL (ref 150–400)
RBC: 3.44 MIL/uL — ABNORMAL LOW (ref 3.87–5.11)
RDW: 13.6 % (ref 11.5–15.5)
WBC: 11.7 10*3/uL — ABNORMAL HIGH (ref 4.0–10.5)
nRBC: 0 % (ref 0.0–0.2)

## 2022-01-29 NOTE — Progress Notes (Signed)
Post Partum Day 1 Subjective: no complaints, up ad lib, and tolerating PO  Breastfeeding  Objective: Blood pressure 105/66, pulse 77, temperature 97.9 F (36.6 C), temperature source Oral, resp. rate 18, height 5' 5.5" (1.664 m), weight 76.2 kg, last menstrual period 01/14/2021, SpO2 98 %, unknown if currently breastfeeding.  Physical Exam:  General: alert and cooperative Lochia: appropriate Uterine Fundus: firm   Recent Labs    01/28/22 1544 01/29/22 0543  HGB 12.9 11.2*  HCT 36.3 32.0*    Assessment/Plan: Plan for discharge tomorrow Declines circumcision   LOS: 1 day   Logan Bores, MD 01/29/2022, 9:58 AM

## 2022-01-29 NOTE — Lactation Note (Addendum)
This note was copied from a baby's chart. Lactation Consultation Note  Patient Name: Carol Singleton Date: 01/29/2022 Reason for consult: Initial assessment;Term Age:36 hours  Birthing parent is a P2 and she cites that infant is sleepy at the breast. Infant was observed latching; birthing parent was assisted with unlatching and getting an asymmetric latch. Slight ridging was noted on nipple when unlatching infant. Birthing parent was encouraged to keep an eye on misshapening of nipple.   Infant's behavior at the breast is appropriate considering infant is only 47 hrs old. Infant has already been to the breast multiple times.   Birthing parent says she knows how to do hand expression. Colostrum containers were provided so that Mom can do hand expression. I encouraged Mom to do hand expression when infant is sleeping to promote earlier lactogenesis II (it took 1 week for her milk to come to volume with her 1st child, but 1st infant did not require formula supplementation during that time).   Birthing parent knows she can call out for me to assist with spoon feeding. Specifics of an asymmetric latch were shown via [employer].    Maternal Data Has patient been taught Hand Expression?: No Does the patient have breastfeeding experience prior to this delivery?: Yes How long did the patient breastfeed?: 2.5 yrs  Feeding Mother's Current Feeding Choice: Breast Milk   Interventions Interventions: Adjust position;Assisted with latch;Education;LC Services brochure   Consult Status Consult Status: Follow-up Date: 01/30/22 Follow-up type: In-patient    Carol Singleton [facility] 01/29/2022, 8:00 AM

## 2022-01-29 NOTE — Lactation Note (Signed)
This note was copied from a baby's chart. Lactation Consultation Note  Patient Name: Carol Singleton [id number] Date: 01/29/2022   Age:36 hours Per RN ( Amy), Birth Parent and infant asleep at this time. Maternal Data    Feeding    LATCH Score                    Lactation Tools Discussed/Used    Interventions    Discharge    Consult Status      Vicente Serene 01/29/2022, 2:22 AM

## 2022-01-29 NOTE — Anesthesia Postprocedure Evaluation (Signed)
Anesthesia Post Note  Patient: Carol Singleton  Procedure(s) Performed: AN AD HOC LABOR EPIDURAL     Patient location during evaluation: Mother Baby Anesthesia Type: Epidural Level of consciousness: awake and alert and oriented Pain management: satisfactory to patient Vital Signs Assessment: post-procedure vital signs reviewed and stable Respiratory status: respiratory function stable Cardiovascular status: stable Postop Assessment: no headache, no backache, epidural receding, patient able to bend at knees, no signs of nausea or vomiting, adequate PO intake and able to ambulate Anesthetic complications: no   No notable events documented.  Last Vitals:  Vitals:   01/29/22 0532 01/29/22 0930  BP: 105/66 106/66  Pulse: 77 79  Resp: 18 16  Temp: 36.6 C 36.7 C  SpO2: 98%     Last Pain:  Vitals:   01/29/22 0930  TempSrc: Oral  PainSc:    Pain Goal: Patients Stated Pain Goal: 0 (01/28/22 1511)                 Febe Champa

## 2022-01-30 MED ORDER — IBUPROFEN 600 MG PO TABS: 600.0000 mg | ORAL_TABLET | Freq: Four times a day (QID) | ORAL | 1 refills | Status: AC | PRN

## 2022-01-30 NOTE — Lactation Note (Signed)
This note was copied from a baby's chart. Lactation Consultation Note  Patient Name: Carol Singleton [id number] Date: 01/30/2022 - Age 36 hours old  Reason for consult: Follow-up assessment;Term;Infant weight loss (7 % weight loss, dyad for D/C. LC reviewed BF D/C teaching and [name] resources. Per mom having some nipple soreness,no breakdown and has nipple balm.)  Maternal Data    Feeding Mother's Current Feeding Choice: Breast Milk  LATCH Score - LC unable to check a latch, baby recently fed.       Interventions - Education and D/C teaching    Discharge Discharge Education: Engorgement and breast care;Warning signs for feeding baby Pump: DEBP;Personal;Manual WIC Program: No  Consult Status Consult Status: Complete Date: 01/30/22    Myer Haff 01/30/2022, 9:44 AM

## 2022-01-30 NOTE — Discharge Instructions (Signed)
Call office with any concerns [phone] 

## 2022-01-30 NOTE — Discharge Summary (Signed)
Postpartum Discharge Summary  Date of Service updated      Patient Name: Carol Singleton DOB: [birth year] MRN: [id number]  Date of admission: 01/28/2022 Delivery date:01/28/2022  Delivering provider: Marlow Baars  Date of discharge: 01/30/2022  Admitting diagnosis: Normal labor [O80, Z37.9] Intrauterine pregnancy: [redacted]w[redacted]d     Secondary diagnosis:  Principal Problem:   Normal labor  Additional problems: none    Discharge diagnosis: Term Pregnancy Delivered                                              Post partum procedures: n/a Augmentation: Pitocin Complications: None  Hospital course: Onset of Labor With Vaginal Delivery      36 y.o. yo [id number] at [redacted]w[redacted]d was admitted in Active Labor on 01/28/2022. Labor course was complicated by n/a  Membrane Rupture Time/Date: 6:19 PM ,01/28/2022   Delivery Method:Vaginal, Spontaneous  Episiotomy: None  Lacerations:  1st degree  Patient had a postpartum course complicated by none.  She is ambulating, tolerating a regular diet, passing flatus, and urinating well. Patient is discharged home in stable condition on 01/30/22.  Newborn Data: Birth date:01/28/2022  Birth time:9:45 PM  Gender:Female  Living status:Living  Apgars:9 ,9  Weight:4070 g   Magnesium Sulfate received: No BMZ received: No  Physical exam  Vitals:   01/29/22 0930 01/29/22 1434 01/29/22 1947 01/30/22 0530  BP: 106/66 105/67 112/69 107/70  Pulse: 79 88 70 91  Resp: 16 16 16 16   Temp: 98 F (36.7 C) 99 F (37.2 C) 98.2 F (36.8 C) 98.6 F (37 C)  TempSrc: Oral  Oral Oral  SpO2:  99%  99%  Weight:      Height:       General: alert, cooperative, and no distress Lochia: appropriate Uterine Fundus: firm Incision: N/A DVT Evaluation: No evidence of DVT seen on physical exam. Labs: Lab Results  Component Value Date   WBC 11.7 (H) 01/29/2022   HGB 11.2 (L) 01/29/2022   HCT 32.0 (L) 01/29/2022   MCV 93.0 01/29/2022   PLT 166 01/29/2022      Latest Ref Rng &  Units 07/23/2021    6:57 PM  CMP  Glucose 70 - 99 mg/dL 09/22/2021   BUN 6 - 20 mg/dL 8   Creatinine 956 - 3.87 mg/dL 5.64   Sodium 3.32 - 951 mmol/L 134   Potassium 3.5 - 5.1 mmol/L 3.4   Chloride 98 - 111 mmol/L 103   CO2 22 - 32 mmol/L 22   Calcium 8.9 - 10.3 mg/dL 8.7   Total Protein 6.5 - 8.1 g/dL 6.9   Total Bilirubin 0.3 - 1.2 mg/dL 1.3   Alkaline Phos 38 - 126 U/L 41   AST 15 - 41 U/L 25   ALT 0 - 44 U/L 12    Edinburgh Score:    01/29/2022    6:03 PM  Edinburgh Postnatal Depression Scale Screening Tool  I have been able to laugh and see the funny side of things. 0  I have looked forward with enjoyment to things. 0  I have blamed myself unnecessarily when things went wrong. 1  I have been anxious or worried for no good reason. 0  I have felt scared or panicky for no good reason. 0  Things have been getting on top of me. 0  I have been so unhappy that  I have had difficulty sleeping. 0  I have felt sad or miserable. 0  I have been so unhappy that I have been crying. 0  The thought of harming myself has occurred to me. 0  Edinburgh Postnatal Depression Scale Total 1      After visit meds:  Allergies as of 01/30/2022       Reactions   Latex Rash, Other (See Comments)   Irritation         Medication List     TAKE these medications    calcium carbonate 500 MG chewable tablet Commonly known as: TUMS - dosed in mg elemental calcium Chew 1 tablet by mouth daily as needed for indigestion or heartburn.   famotidine 20 MG tablet Commonly known as: PEPCID Take 1 tablet (20 mg total) by mouth 2 (two) times daily.   FISH OIL PO Take 1 capsule by mouth daily.   ibuprofen 600 MG tablet Commonly known as: ADVIL Take 1 tablet (600 mg total) by mouth every 6 (six) hours as needed for moderate pain or cramping.   loperamide 2 MG capsule Commonly known as: IMODIUM Take 1 capsule (2 mg total) by mouth 4 (four) times daily as needed for diarrhea or loose stools.    ondansetron 4 MG disintegrating tablet Commonly known as: ZOFRAN-ODT Take 1-2 tablets (4-8 mg total) by mouth every 6 (six) hours as needed for nausea or vomiting.   prenatal multivitamin Tabs tablet Take 1 tablet by mouth daily at 12 noon.         Discharge home in stable condition Infant Feeding: Breast Infant Disposition:home with mother Discharge instruction: per After Visit Summary and Postpartum booklet. Activity: Advance as tolerated. Pelvic rest for 6 weeks.  Diet: routine diet Anticipated Birth Control: Unsure Postpartum Appointment:6 weeks Additional Postpartum F/U: Postpartum Depression checkup Future Appointments:No future appointments. Follow up Visit:  Follow-up Information     Ob/Gyn, [facility]. Schedule an appointment as soon as possible for a visit in 6 week(s).   Why: For postpartum visit Contact information: [place] [address] [phone]                     01/30/2022 Isaiah Serge, DO

## 2022-01-30 NOTE — Progress Notes (Signed)
Post Partum Day 2 Subjective: no complaints, up ad lib, voiding, tolerating PO, + flatus, and lochia mild . She denies HA, CP, SOB or dizziness. She is bonding well with baby - breastfeeding. Feels ready for discharge to home today.   Objective: Blood pressure 107/70, pulse 91, temperature 98.6 F (37 C), temperature source Oral, resp. rate 16, height 5' 5.5" (1.664 m), weight 76.2 kg, last menstrual period 01/14/2021, SpO2 99 %, unknown if currently breastfeeding.  Physical Exam:  General: alert, cooperative, and no distress Lochia: appropriate Uterine Fundus: firm Incision: n/a DVT Evaluation: No evidence of DVT seen on physical exam.  Recent Labs    01/28/22 1544 01/29/22 0543  HGB 12.9 11.2*  HCT 36.3 32.0*    Assessment/Plan: Discharge home and Breastfeeding Routine 6 week pp visit    LOS: 2 days   Jouri Threat W Sri Clegg, DO 01/30/2022, 12:04 PM

## 2022-02-08 ENCOUNTER — Telehealth (HOSPITAL_COMMUNITY): Payer: Self-pay | Admitting: *Deleted

## 2022-02-08 NOTE — Telephone Encounter (Signed)
Attempted Hospital Discharge Follow-Up Call.  Left voice mail requesting that patient return RN's phone call if patient has any concerns or questions regarding herself or her baby.  

## 2022-07-21 IMAGING — US US OB < 14 WEEKS - US OB TV
1 series · 15 of 28 positions shown · non-contrast
Comparison: None.

CLINICAL DATA: Vaginal bleeding

EXAM:
OBSTETRIC <14 WK US AND TRANSVAGINAL OB US
TECHNIQUE: Both transabdominal and transvaginal ultrasound examinations were
performed for complete evaluation of the gestation as well as the
maternal uterus, adnexal regions, and pelvic cul-de-sac.
Transvaginal technique was performed to assess early pregnancy.

[Series 1: us ob < 14 weeks - us ob tv · 15 of 85 slices shown]
[im 1/85]
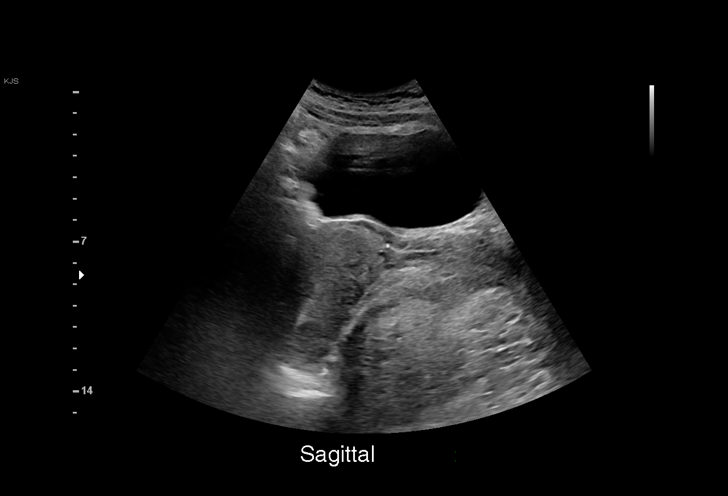
[im 7/85]
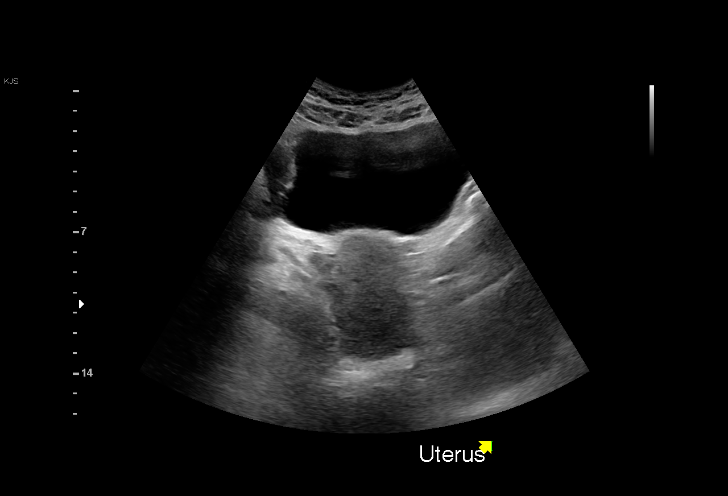
[im 13/85]
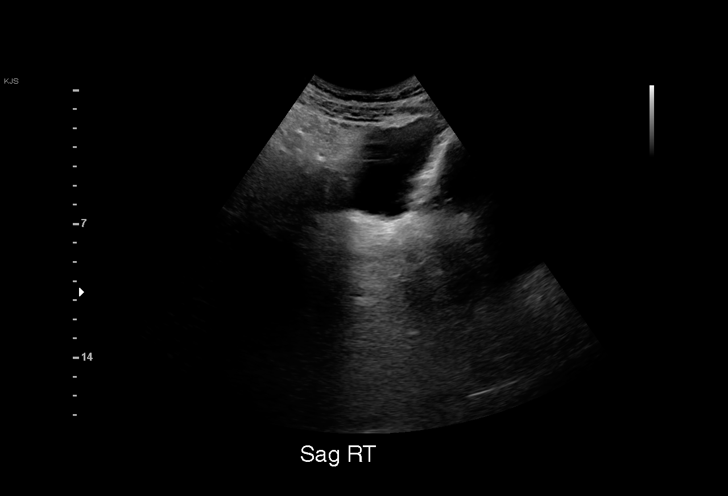
[im 19/85]
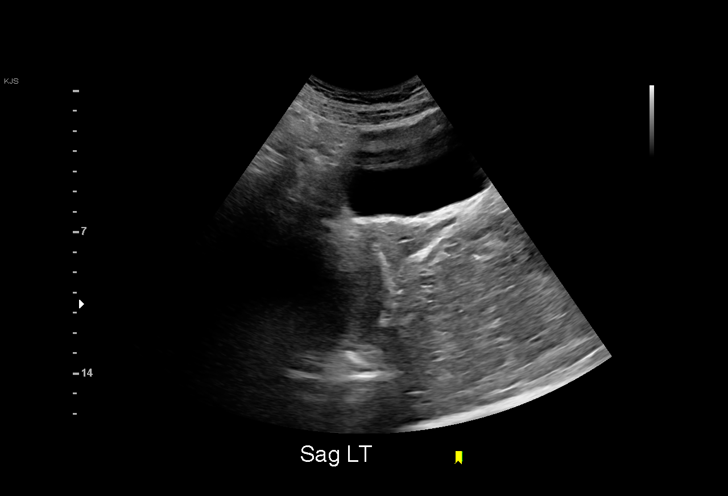
[im 25/85]
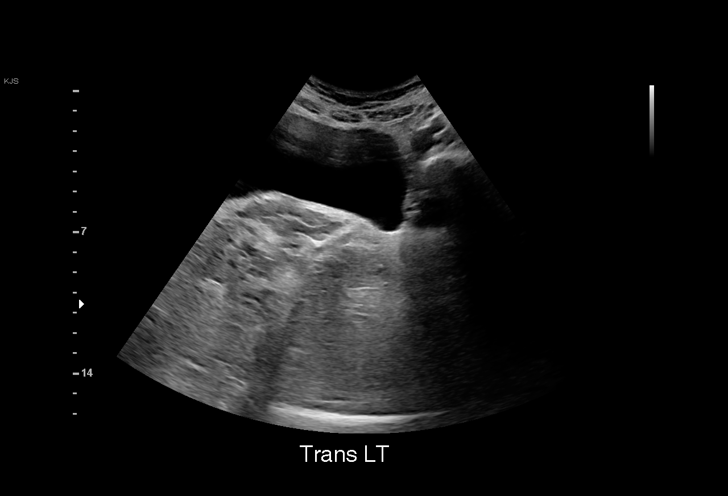
[im 32/85]
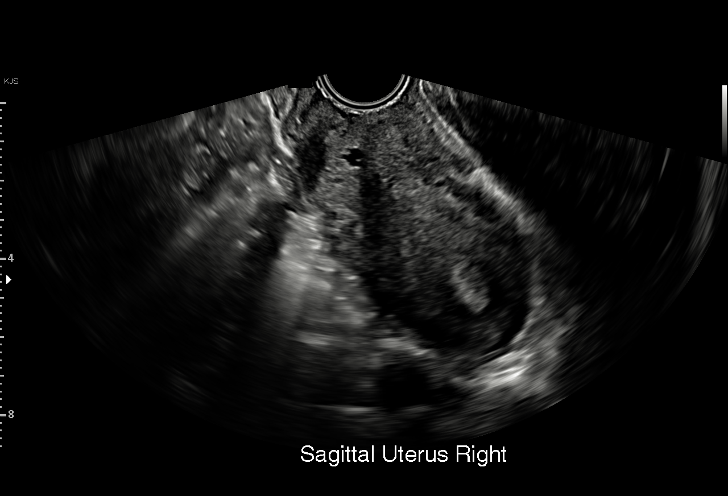
[im 38/85]
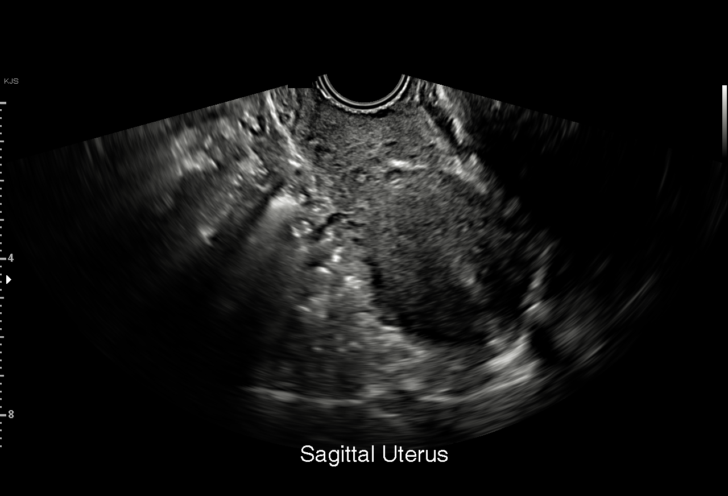
[im 44/85]
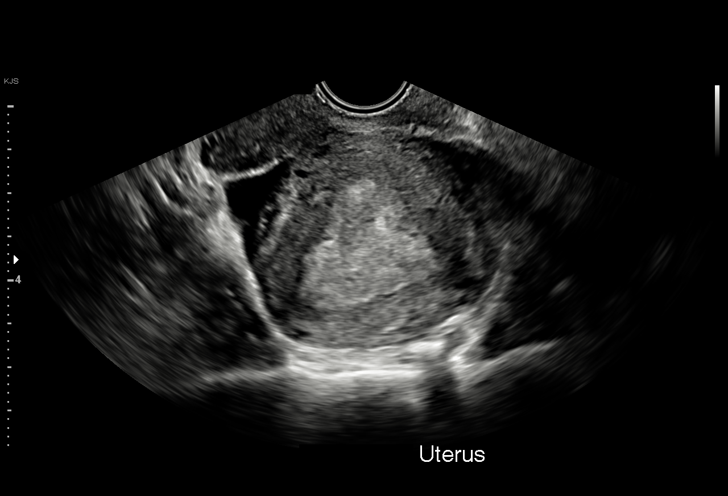
[im 47/85]
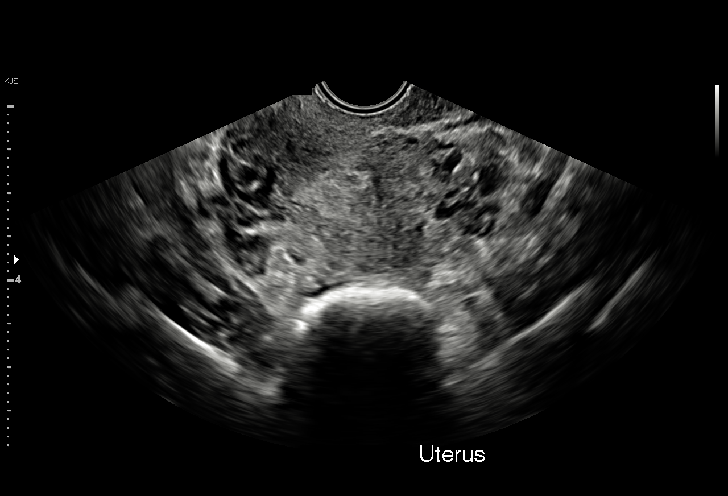
[im 53/85]
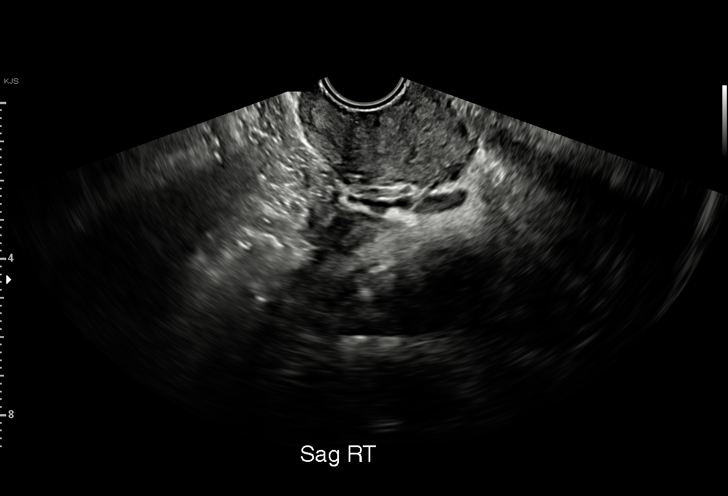
[im 60/85]
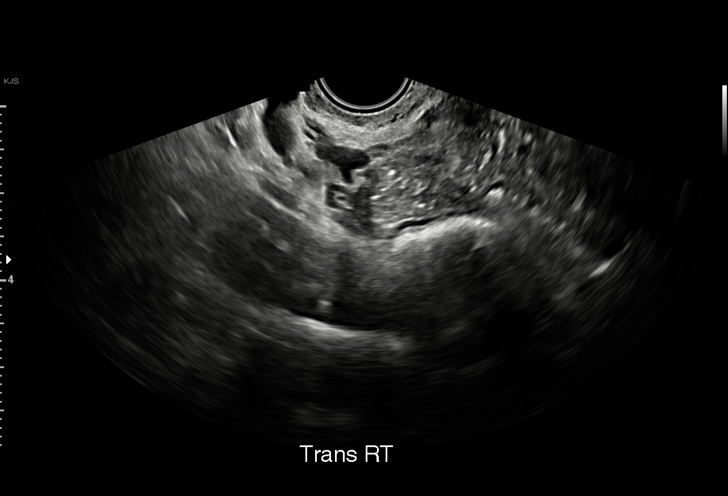
[im 66/85]
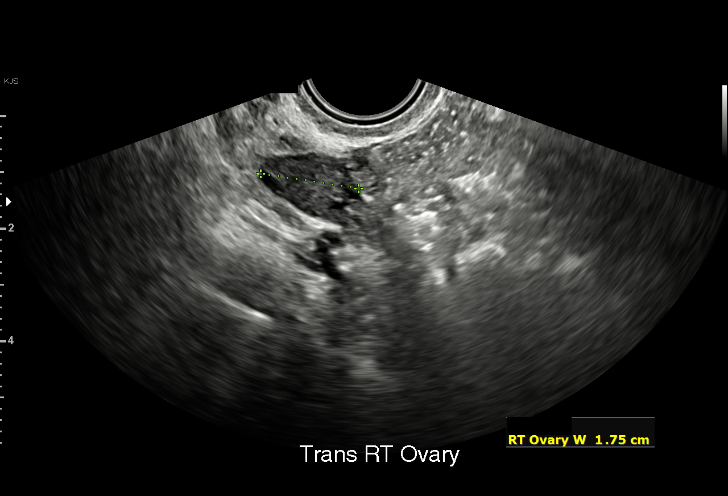
[im 72/85]
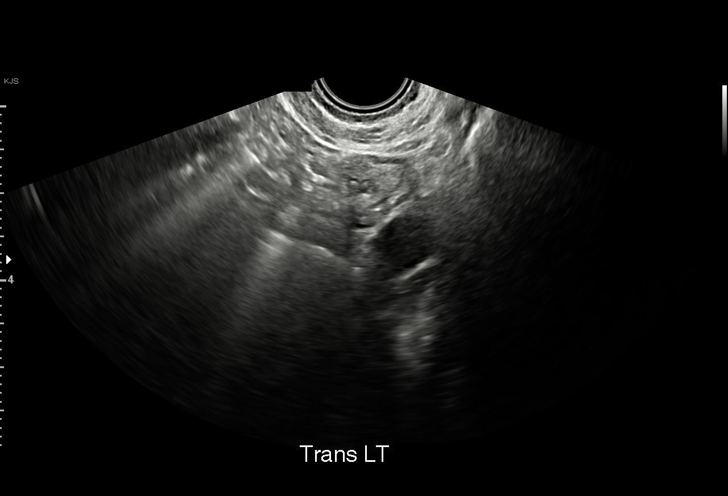
[im 78/85]
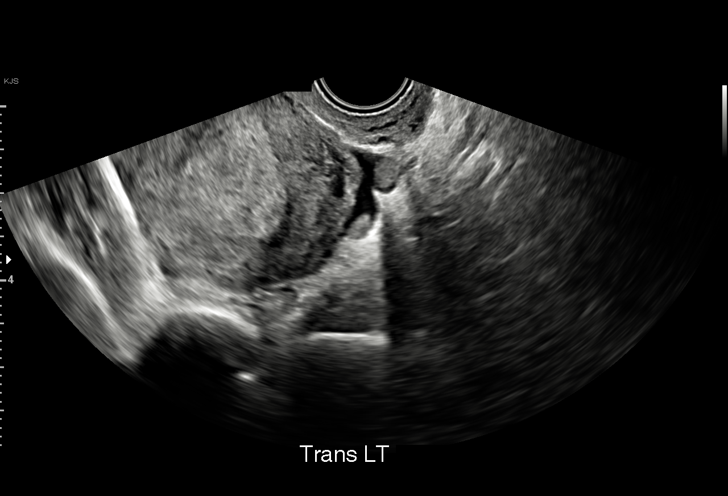
[im 85/85]
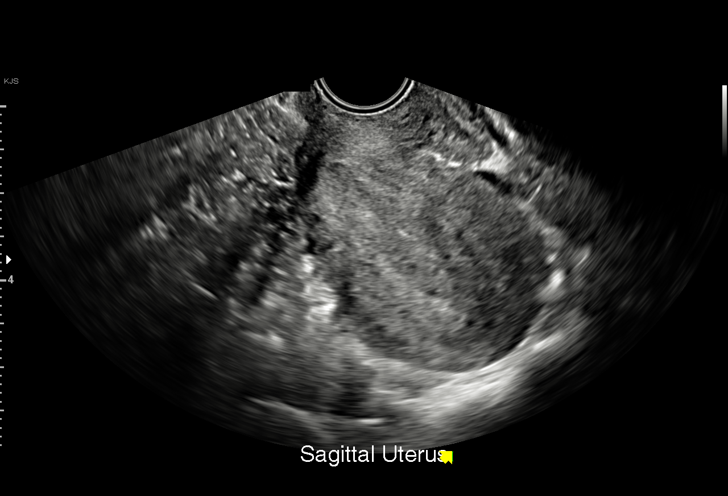

[15 of 28 positions shown; findings below may reference images not displayed]

FINDINGS: Intrauterine gestational sac: None

Yolk sac:  Not Visualized.

Embryo:  Not Visualized.

Cardiac Activity: Not Visualized.

Heart Rate: Not applicable

MSD: Not applicable

CRL:  Not applicable

Subchorionic hemorrhage:  Not applicable.

Maternal uterus/adnexae: Normal bilateral ovaries with corpus luteum
in the left ovary. The endometrium appears thickened and
heterogeneous measuring up to 16 mm.
IMPRESSION: Pregnancy of unknown location in this patient with positive urine
pregnancy test. No gestational sac visualized. Heterogeneous and
thickened endometrium measuring up to 16 mm possibly with some blood
products within the endometrial canal. Recommend trending
quantitative beta hCGs, close clinical follow-up with OBGYN, and
repeat ultrasound as clinically indicated.

## 2024-01-17 ENCOUNTER — Other Ambulatory Visit (HOSPITAL_BASED_OUTPATIENT_CLINIC_OR_DEPARTMENT_OTHER): Payer: Self-pay

## 2024-01-17 MED ORDER — FLUZONE 0.5 ML IM SUSY
0.5000 mL | PREFILLED_SYRINGE | Freq: Once | INTRAMUSCULAR | 0 refills | Status: AC
  Filled 2024-01-17: qty 0.5, 1d supply, fill #0

## 2024-04-22 ENCOUNTER — Other Ambulatory Visit (HOSPITAL_BASED_OUTPATIENT_CLINIC_OR_DEPARTMENT_OTHER): Payer: Self-pay

## 2024-04-22 MED ORDER — COMIRNATY 30 MCG/0.3ML IM SUSY
0.3000 mL | PREFILLED_SYRINGE | Freq: Once | INTRAMUSCULAR | 0 refills | Status: AC
  Filled 2024-04-22: qty 0.3, 1d supply, fill #0
